# Patient Record
Sex: Female | Born: 1985 | Race: White | Hispanic: No | Marital: Married | Smoking: Former smoker
Health system: Southern US, Community
[De-identification: ages and names within clinical notes are randomized; demographics above are authoritative.]

## PROBLEM LIST (undated history)

## (undated) DIAGNOSIS — F419 Anxiety disorder, unspecified: Secondary | ICD-10-CM

## (undated) DIAGNOSIS — F32A Depression, unspecified: Secondary | ICD-10-CM

## (undated) DIAGNOSIS — F329 Major depressive disorder, single episode, unspecified: Secondary | ICD-10-CM

## (undated) DIAGNOSIS — I10 Essential (primary) hypertension: Secondary | ICD-10-CM

## (undated) DIAGNOSIS — E039 Hypothyroidism, unspecified: Secondary | ICD-10-CM

## (undated) DIAGNOSIS — D649 Anemia, unspecified: Secondary | ICD-10-CM

## (undated) DIAGNOSIS — R1115 Cyclical vomiting syndrome unrelated to migraine: Secondary | ICD-10-CM

## (undated) HISTORY — PX: WISDOM TOOTH EXTRACTION: SHX21

---

## 2005-09-30 ENCOUNTER — Emergency Department (HOSPITAL_COMMUNITY): Admission: EM | Admit: 2005-09-30 | Discharge: 2005-09-30 | Payer: Self-pay | Admitting: Family Medicine

## 2006-01-22 ENCOUNTER — Emergency Department (HOSPITAL_COMMUNITY): Admission: EM | Admit: 2006-01-22 | Discharge: 2006-01-22 | Payer: Self-pay | Admitting: Emergency Medicine

## 2008-01-30 ENCOUNTER — Emergency Department (HOSPITAL_COMMUNITY): Admission: EM | Admit: 2008-01-30 | Discharge: 2008-01-30 | Payer: Self-pay | Admitting: Emergency Medicine
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
## 2010-07-30 ENCOUNTER — Ambulatory Visit: Payer: Self-pay | Admitting: Hematology & Oncology

## 2010-08-23 ENCOUNTER — Other Ambulatory Visit: Payer: Self-pay | Admitting: Hematology & Oncology

## 2010-08-23 ENCOUNTER — Encounter (HOSPITAL_BASED_OUTPATIENT_CLINIC_OR_DEPARTMENT_OTHER): Payer: BLUE CROSS/BLUE SHIELD | Admitting: Hematology & Oncology

## 2010-08-23 DIAGNOSIS — E039 Hypothyroidism, unspecified: Secondary | ICD-10-CM

## 2010-08-23 DIAGNOSIS — F3289 Other specified depressive episodes: Secondary | ICD-10-CM

## 2010-08-23 DIAGNOSIS — D72829 Elevated white blood cell count, unspecified: Secondary | ICD-10-CM

## 2010-08-23 DIAGNOSIS — F329 Major depressive disorder, single episode, unspecified: Secondary | ICD-10-CM

## 2010-08-23 DIAGNOSIS — I10 Essential (primary) hypertension: Secondary | ICD-10-CM

## 2010-08-23 LAB — CBC WITH DIFFERENTIAL (CANCER CENTER ONLY)
BASO#: 0 10*3/uL (ref 0.0–0.2)
Eosinophils Absolute: 0.3 10*3/uL (ref 0.0–0.5)
HCT: 37.1 % (ref 34.8–46.6)
HGB: 13 g/dL (ref 11.6–15.9)
LYMPH#: 4.2 10*3/uL — ABNORMAL HIGH (ref 0.9–3.3)
MONO#: 0.6 10*3/uL (ref 0.1–0.9)
NEUT#: 7.8 10*3/uL — ABNORMAL HIGH (ref 1.5–6.5)
RBC: 4.33 10*6/uL (ref 3.70–5.32)

## 2010-08-23 LAB — SEDIMENTATION RATE: Sed Rate: 1 mm/hr (ref 0–22)

## 2010-08-23 LAB — VITAMIN B12: Vitamin B-12: 296 pg/mL (ref 211–911)

## 2010-08-23 LAB — LACTATE DEHYDROGENASE: LDH: 127 U/L (ref 94–250)

## 2010-08-27 LAB — BCR/ABL (LIO MMD)

## 2010-08-27 LAB — JAK-2 V617F

## 2011-02-10 LAB — URINALYSIS, ROUTINE W REFLEX MICROSCOPIC
Bilirubin Urine: NEGATIVE
Hgb urine dipstick: NEGATIVE
Ketones, ur: NEGATIVE
Protein, ur: NEGATIVE
Specific Gravity, Urine: 1.013
Urobilinogen, UA: 0.2

## 2012-06-27 ENCOUNTER — Emergency Department (HOSPITAL_COMMUNITY)
Admission: EM | Admit: 2012-06-27 | Discharge: 2012-06-27 | Disposition: A | Discharge status: Home / Self Care | Payer: BC Managed Care – PPO | Attending: Emergency Medicine | Admitting: Emergency Medicine

## 2012-06-27 ENCOUNTER — Encounter (HOSPITAL_COMMUNITY): Payer: Self-pay | Admitting: *Deleted

## 2012-06-27 DIAGNOSIS — Y929 Unspecified place or not applicable: Secondary | ICD-10-CM | POA: Insufficient documentation

## 2012-06-27 DIAGNOSIS — F17200 Nicotine dependence, unspecified, uncomplicated: Secondary | ICD-10-CM | POA: Insufficient documentation

## 2012-06-27 DIAGNOSIS — Z79899 Other long term (current) drug therapy: Secondary | ICD-10-CM | POA: Insufficient documentation

## 2012-06-27 DIAGNOSIS — T25229A Burn of second degree of unspecified foot, initial encounter: Secondary | ICD-10-CM | POA: Insufficient documentation

## 2012-06-27 DIAGNOSIS — Y939 Activity, unspecified: Secondary | ICD-10-CM | POA: Insufficient documentation

## 2012-06-27 DIAGNOSIS — T25022A Burn of unspecified degree of left foot, initial encounter: Secondary | ICD-10-CM

## 2012-06-27 DIAGNOSIS — X118XXA Contact with other hot tap-water, initial encounter: Secondary | ICD-10-CM | POA: Insufficient documentation

## 2012-06-27 MED ORDER — IBUPROFEN 600 MG PO TABS: 600.0000 mg | ORAL_TABLET | Freq: Three times a day (TID) | ORAL | Status: DC | PRN

## 2012-06-27 MED ORDER — IBUPROFEN 200 MG PO TABS
600.0000 mg | ORAL_TABLET | Freq: Once | ORAL | Status: AC
  Administered 2012-06-27: 600 mg via ORAL
  Filled 2012-06-27: qty 3

## 2012-06-27 MED ORDER — OXYCODONE-ACETAMINOPHEN 5-325 MG PO TABS
1.0000 | ORAL_TABLET | Freq: Once | ORAL | Status: AC
  Administered 2012-06-27: 1 via ORAL
  Filled 2012-06-27: qty 1

## 2012-06-27 MED ORDER — SILVER SULFADIAZINE 1 % EX CREA
TOPICAL_CREAM | Freq: Once | CUTANEOUS | Status: AC
  Administered 2012-06-27: 04:00:00 via TOPICAL
  Filled 2012-06-27: qty 85

## 2012-06-27 MED ORDER — OXYCODONE-ACETAMINOPHEN 5-325 MG PO TABS: 1.0000 | ORAL_TABLET | ORAL | Status: DC | PRN

## 2012-06-27 NOTE — ED Notes (Signed)
Cream applied, wound wrapped for pt to go home.
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
## 2012-06-27 NOTE — Discharge Instructions (Signed)
Burn Care  Your skin is a natural barrier to infection. It is the largest organ of your body. Burns damage this natural protection. To help prevent infection, it is very important to follow your caregiver's instructions in the care of your burn.  Burns are classified as:  · First degree. There is only redness of the skin (erythema). No scarring is expected.  · Second degree. There is blistering of the skin. Scarring may occur with deeper burns.  · Third degree. All layers of the skin are injured, and scarring is expected.  HOME CARE INSTRUCTIONS   · Wash your hands well before changing your bandage.  · Change your bandage as often as directed by your caregiver.  · Remove the old bandage. If the bandage sticks, you may soak it off with cool, clean water.  · Cleanse the burn thoroughly but gently with mild soap and water.  · Pat the area dry with a clean, dry cloth.  · Apply a thin layer of antibacterial cream to the burn.  · Apply a clean bandage as instructed by your caregiver.  · Keep the bandage as clean and dry as possible.  · Elevate the affected area for the first 24 hours, then as instructed by your caregiver.  · Only take over-the-counter or prescription medicines for pain, discomfort, or fever as directed by your caregiver.  SEEK IMMEDIATE MEDICAL CARE IF:   · You develop excessive pain.  · You develop redness, tenderness, swelling, or red streaks near the burn.  · The burned area develops yellowish-white fluid (pus) or a bad smell.  · You have a fever.  MAKE SURE YOU:   · Understand these instructions.  · Will watch your condition.  · Will get help right away if you are not doing well or get worse.  Document Released: 04/28/2005 Document Revised: 07/21/2011 Document Reviewed: 09/18/2010  Exit[employer]® Patient Information ©2013 [employer].

## 2015-12-10 ENCOUNTER — Encounter: Payer: Self-pay | Admitting: Hematology & Oncology

## 2015-12-28 ENCOUNTER — Encounter: Payer: Self-pay | Admitting: Hematology & Oncology

## 2015-12-28 ENCOUNTER — Ambulatory Visit: Payer: Managed Care, Other (non HMO)

## 2015-12-28 ENCOUNTER — Ambulatory Visit (HOSPITAL_BASED_OUTPATIENT_CLINIC_OR_DEPARTMENT_OTHER): Payer: Managed Care, Other (non HMO) | Admitting: Hematology & Oncology

## 2015-12-28 ENCOUNTER — Other Ambulatory Visit (HOSPITAL_BASED_OUTPATIENT_CLINIC_OR_DEPARTMENT_OTHER): Payer: Managed Care, Other (non HMO)

## 2015-12-28 VITALS — BP 101/62 | HR 64 | Temp 98.6°F | Resp 16 | Ht 68.0 in | Wt 157.0 lb

## 2015-12-28 DIAGNOSIS — D72829 Elevated white blood cell count, unspecified: Secondary | ICD-10-CM

## 2015-12-28 LAB — CBC WITH DIFFERENTIAL (CANCER CENTER ONLY)
BASO#: 0 10*3/uL (ref 0.0–0.2)
BASO%: 0.4 % (ref 0.0–2.0)
EOS%: 2.9 % (ref 0.0–7.0)
Eosinophils Absolute: 0.3 10*3/uL (ref 0.0–0.5)
HCT: 31.9 % — ABNORMAL LOW (ref 34.8–46.6)
HGB: 10.5 g/dL — ABNORMAL LOW (ref 11.6–15.9)
LYMPH#: 3.2 10*3/uL (ref 0.9–3.3)
LYMPH%: 32.5 % (ref 14.0–48.0)
MCH: 28.9 pg (ref 26.0–34.0)
MCHC: 32.9 g/dL (ref 32.0–36.0)
MCV: 88 fL (ref 81–101)
MONO#: 0.8 10*3/uL (ref 0.1–0.9)
MONO%: 8.2 % (ref 0.0–13.0)
NEUT#: 5.6 10*3/uL (ref 1.5–6.5)
NEUT%: 56 % (ref 39.6–80.0)
PLATELETS: 312 10*3/uL (ref 145–400)
RBC: 3.63 10*6/uL — AB (ref 3.70–5.32)
RDW: 12.7 % (ref 11.1–15.7)
WBC: 9.9 10*3/uL (ref 3.9–10.0)

## 2015-12-28 LAB — CHCC SATELLITE - SMEAR

## 2015-12-28 NOTE — Progress Notes (Signed)
Referral MD  Reason for Referral: Transient leukocytosis   Chief Complaint  Patient presents with  . Follow-up  : My white cell count has been high.  HPI: Anna Palmer is a very nice 30 year old white female. She is very interesting.  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 She obviously is in very good shape.   She was commonly referred because of transient leukocytosis. This obviously has been going on for 5 years. Back in April 2012, she had white cell count of 12.8. Showed a normal white cell differential.  Lab work that was also sent over showed back in February 2016 her white cell count to be 10.1. In June 2017, her white cell count was 11.1.  Most recently in July, her white cell count was 12.9. Hemoglobin 12.6 and platelet count 205,000. She had a normal white cell differential.  The big problem for her is that she has heavy cycles. She has fibroids. She'll be undergoing a myomectomy in October.  She does smoke. She probably smokes a half pack per day area did she's had no problem with infections. She's had no issues with skin rashes. She's had no weight loss or weight gain. She does have some hypothyroidism.   There's been no change in her medications.   She's had no foreign travel.   She does have quite a bit of body art but nothing that has been new.   She does have a glass of wine every now and then.   There is no family history of blood issues. She's had no past surgery. She's had no skin lesions removed.   Overall, her performance status is ECOG 0.    No past medical history on file.:  No past surgical history on file.:   Current Outpatient Prescriptions:  .  ALPRAZolam (XANAX) 0.5 MG tablet, Take 0.5 mg by mouth 2 (two) times daily as needed for anxiety., Disp: , Rfl:  .  levothyroxine (SYNTHROID, LEVOTHROID) 50 MCG tablet, Take 50 mcg by mouth daily., Disp: , Rfl:  .  lisinopril (PRINIVIL,ZESTRIL) 20 MG tablet, Take 20 mg by mouth daily., Disp: , Rfl:  .   norgestrel-ethinyl estradiol (LO/OVRAL,CRYSELLE) 0.3-30 MG-MCG tablet, Take 1 tablet by mouth daily., Disp: , Rfl:  .  PARoxetine (PAXIL) 40 MG tablet, Take 40 mg by mouth every morning., Disp: , Rfl:  .  zolpidem (AMBIEN) 5 MG tablet, Take 5 mg by mouth at bedtime as needed for sleep., Disp: , Rfl: :  :  No Known Allergies:  No family history on file.:  Social History   Social History  . Marital status: Single    Spouse name: N/A  . Number of children: N/A  . Years of education: N/A   Occupational History  . Not on file.   Social History Main Topics  . Smoking status: Current Every Day Smoker  . Smokeless tobacco: Never Used  . Alcohol use Yes  . Drug use: Unknown  . Sexual activity: Not on file   Other Topics Concern  . Not on file   Social History Narrative  . No narrative on file  :  Pertinent items are noted in HPI.  Exam: @IPVITALS @  well-developed and well-nourished white female in no obvious distress. Vital signs show a temperature of 98.6. Pulse 64. Blood pressure 101/62. Weight is 157 pounds. Head and neck exam shows no ocular or oral lesions. She has no palpable cervical or supraclavicular lymph nodes. Lungs are clear bilaterally. Cardiac exam regular rate  and rhythm with no murmurs, rubs or bruits. Abdomen is soft. She has good bowel sounds. There is no fluid wave. There is no palpable liver or spleen tip. Back exam shows no tenderness over the spine, ribs or hips. Extremities shows no clubbing, cyanosis or edema. She is good range of motion of her joints. There is no joint erythema or warmth. Skin exam shows no rashes, ecchymoses or petechia. Neurological exam shows no focal neurological deficits.    Recent Labs  12/28/15 1504  WBC 9.9  HGB 10.5*  HCT 31.9*  PLT 312   No results for input(s): NA, K, CL, CO2, GLUCOSE, BUN, CREATININE, CALCIUM in the last 72 hours.  Blood smear review:  Normochromic and normal see population of red blood cells. There is  no nucleated red blood cells. There are no teardrop cells. White blood cells are normal in morphology maturation. She has no hypersegmented polys. There is no immature myeloid or lymphoid forms. She has no atypical lymphocytes. Platelets are adequate in number and size.   Pathology: None     Assessment and Plan:  Anna Palmer is a very nice 30 year old white female with transient leukocytosis. I cannot find anything on her exam or on her blood smear that looks like a hematologic malignancy. Her white cell count is normal today.  She has had leukocytosis for 5 years. It is been mild at best. I would have to think that this is reactive. It might be related to her monthly cycles. It might be related to her smoking. I don't see anything that looks pathologic.  It will be interesting to see what happens after she has her uterine surgery. Possibly, this might be the "trigger" for the transient leuko-cytosis.  For now, I think we can probably get her back in about 4 or 5 months. I want to get her back after the holidays. She will have her surgery in October so I want to make sure that she heals up from this.  I spent about 40 minutes with her. She is very nice. She is very eloquent. It was fun talking to her. I answered her questions. I try to reassure her that I did not think that there was anything that we would have to treat.

## 2016-02-21 NOTE — Patient Instructions (Addendum)
Your procedure is scheduled on:  Monday, Oct. 23, 2017  Enter through the Main Entrance of [facility] at:  6:00 AM  Pick up the phone at the desk and dial [phone].  Call this number if you have problems the morning of surgery: [phone].  Remember: Do NOT eat food or drink after:  Midnight Sunday  Take these medicines the morning of surgery with a SIP OF WATER:  None  Stop ALL herbal medications at this time  Do NOT smoke the day of surgery  Do NOT wear jewelry (body piercing), metal hair clips/bobby pins, make-up, or nail polish. Do NOT wear lotions, powders, or perfumes.  You may wear deodorant. Do NOT shave for 48 hours prior to surgery. Do NOT bring valuables to the hospital. Contacts, dentures, or bridgework may not be worn into surgery.  Leave suitcase in car.  After surgery it may be brought to your room.  For patients admitted to the hospital, checkout time is 11:00 AM the day of discharge.

## 2016-02-25 ENCOUNTER — Encounter (HOSPITAL_COMMUNITY): Payer: Self-pay

## 2016-02-25 ENCOUNTER — Encounter (HOSPITAL_COMMUNITY)
Admission: RE | Admit: 2016-02-25 | Discharge: 2016-02-25 | Disposition: A | Discharge status: Home / Self Care | Payer: Managed Care, Other (non HMO) | Source: Ambulatory Visit | Attending: Obstetrics & Gynecology | Admitting: Obstetrics & Gynecology

## 2016-02-25 DIAGNOSIS — Z01818 Encounter for other preprocedural examination: Secondary | ICD-10-CM | POA: Diagnosis present

## 2016-02-25 HISTORY — DX: Major depressive disorder, single episode, unspecified: F32.9

## 2016-02-25 HISTORY — DX: Anemia, unspecified: D64.9

## 2016-02-25 HISTORY — DX: Essential (primary) hypertension: I10

## 2016-02-25 HISTORY — DX: Depression, unspecified: F32.A

## 2016-02-25 HISTORY — DX: Anxiety disorder, unspecified: F41.9

## 2016-02-25 HISTORY — DX: Hypothyroidism, unspecified: E03.9

## 2016-02-25 LAB — BASIC METABOLIC PANEL
Anion gap: 6 (ref 5–15)
BUN: 8 mg/dL (ref 6–20)
CHLORIDE: 107 mmol/L (ref 101–111)
CO2: 25 mmol/L (ref 22–32)
Calcium: 9.2 mg/dL (ref 8.9–10.3)
Creatinine, Ser: 0.53 mg/dL (ref 0.44–1.00)
GFR calc non Af Amer: >60 mL/min (ref >60)
Glucose, Bld: 88 mg/dL (ref 65–99)
POTASSIUM: 4 mmol/L (ref 3.5–5.1)
SODIUM: 138 mmol/L (ref 135–145)

## 2016-02-25 LAB — CBC
HEMATOCRIT: 28.7 % — AB (ref 36.0–46.0)
Hemoglobin: 8.9 g/dL — ABNORMAL LOW (ref 12.0–15.0)
MCH: 25 pg — ABNORMAL LOW (ref 26.0–34.0)
MCHC: 31 g/dL (ref 30.0–36.0)
MCV: 80.6 fL (ref 78.0–100.0)
PLATELETS: 346 10*3/uL (ref 150–400)
RBC: 3.56 MIL/uL — AB (ref 3.87–5.11)
RDW: 13.9 % (ref 11.5–15.5)
WBC: 10 10*3/uL (ref 4.0–10.5)

## 2016-02-25 LAB — TYPE AND SCREEN
ABO/RH(D): A POS
ANTIBODY SCREEN: NEGATIVE

## 2016-02-25 LAB — ABO/RH: ABO/RH(D): A POS

## 2016-02-27 ENCOUNTER — Other Ambulatory Visit (HOSPITAL_COMMUNITY): Payer: Self-pay | Admitting: *Deleted

## 2016-02-28 ENCOUNTER — Ambulatory Visit (HOSPITAL_COMMUNITY)
Admission: RE | Admit: 2016-02-28 | Discharge: 2016-02-28 | Disposition: A | Discharge status: Home / Self Care | Payer: Managed Care, Other (non HMO) | Source: Ambulatory Visit | Attending: Obstetrics & Gynecology | Admitting: Obstetrics & Gynecology

## 2016-02-28 DIAGNOSIS — D649 Anemia, unspecified: Secondary | ICD-10-CM | POA: Insufficient documentation

## 2016-02-28 DIAGNOSIS — Z130 Encounter for screening for diseases of the blood and blood-forming organs and certain disorders involving the immune mechanism: Secondary | ICD-10-CM | POA: Diagnosis present

## 2016-02-28 MED ORDER — SODIUM CHLORIDE 0.9 % IV SOLN
510.0000 mg | Freq: Once | INTRAVENOUS | Status: AC
  Administered 2016-02-28: 510 mg via INTRAVENOUS
  Filled 2016-02-28: qty 17

## 2016-02-28 NOTE — Progress Notes (Signed)
Pt here today for her first dose of IV feraheme.  Graceann Congress, RN and I were in with the patient.  I educated her on the possible side effects of nausea, the possibilty of a rise or fall in her BP, and that it would take 15 min for her to receive the medicine, then we would keep her for 30 min post infusio, just to let us know if she started to fell bad at any point.  Pt verbalized understanding.  Izora Gala Long asked her if she was pregnant and the pt denied pregnancy, and Izora Gala proceeded to place the IV. We hung the medication, as it was infusing I asked if she was okay and she stated yes, and stated that the medicine was just cold.  I asked her if she would like something to drink and she asked for water so I went to get it.  While getting the water Graceann Congress, RN yelled for help and to get some normal saline.  When I got in the room the patient appeared to have passed out, was pale, diaphoretic, her arms and hands were drawn in towards her chest, and her head was turned to the side, and she was unresponsive to verbal stimuli. Izora Gala had stopped the IV feraheme, laid the patient back in the chair, we obtained VS, BP was elevated from her baseline, sats 100%, and we hung a bolus of NS.  Pt only received an estimated 2 minutes of the medication.  After an estimated minute or less the patient opened her eyes to verbal stimuli, and her coloring began to return.  Anette Guarneri RN called Dr Nori Riis while Graceann Congress RN and I stayed with the patient.  Dorian Pod from Dr Kara Mead office stated to have the patient come to his office after she leaves here to be evaluated and to give the whole 250cc of NS bolus.  Pt's VS returned to basline, and she stated she was feeling better after approximately 15 minutes.  I instructed the patient to take her time, continue to rest, she drank a cup of water as well as a cup of coke.  I offered her some crackers but she did not feel up to eating.  Pt's VS remained stable 30 minutes after incident so I  gave her her call bell, told her that she can recover here with [place] until she feels better and if she feels like she needs it, she can have someone to come get her to take her to Dr. Verlon Au office.  Pt verbalized understanding and is resting comfortably. Will continue to monitor.

## 2016-02-28 NOTE — Progress Notes (Signed)
An hour post feraheme incident Pt's VS remained stable and at baseline, coloring is at baseline, pt smiling and states she feels okay to leave and okay to drive herself to the Dr's office.  Pt denies any dizziness with standing or sitting and I called and had the volunteers take her in a wheelchair to her car.  Pt DC home in stable condition.

## 2016-02-28 NOTE — H&P (Signed)
NAME:  RASHA, TUTT NO.:  [id number]  MEDICAL RECORD NO.:  [id number]  LOCATION:                                 FACILITY:  PHYSICIAN:  Maisie Fus, M.D.   DATE OF BIRTH:  1985/09/04  DATE OF ADMISSION: DATE OF DISCHARGE:                             HISTORY & PHYSICAL   PRESENT ILLNESS:  The patient is a 30 year old, white single female, nulligravida, who has a known history of very heavy menses with secondary anemia and previously had ultrasound findings in the office, which demonstrated at least 5 uterine leiomyomata.  There was a subserosal fibroid of 6.5 cm.  An intramural fibroid of 5.3 cm and a second intramural fibroid of 3.7 cm and 2 intracavitary fibroids measuring 2.5 cm and 3.1 cm.  The patient is contemplating marriage in the near future and is interested in preserving fertility.  Given her persistent menorrhagia and secondary anemia, she has requested definitive surgery and she is admitted now for a myomectomy.  She was in the office today for her preoperative consultation and at that time potential risk of the procedure included, which were discussed with the patient included the potential for a hysterectomy in the event of uncontrollable hemorrhage intraoperatively.  The risk of injury to other organs, risk of deep vein thrombosis, and the risk of infection and appropriate prophylactic measures have been discussed with the patient and she is admitted and prepared to proceed with surgery on the 23rd. Her hemoglobin in the office today was 8.5.  The plan is for her to have an iron infusion in the near future.  CURRENT REVIEW OF SYSTEMS:  Otherwise, negative.  The patient has negative GI, GU, cardiopulmonary symptoms.  PAST MEDICAL HISTORY:  Patient has no known significant medical issues except for hypertension and thyroid disease.  She takes lisinopril 20 mg a day.  She takes levothyroxine 50 mcg a day.  She takes Paxil 40 mg a day for  anxiety.  ALLERGIES:  She has no known allergies to medications.  SOCIAL HISTORY:  She smokes one-half pack of cigarettes per day.  She has approximately 1 drink per day.  FAMILY HISTORY:  Noncontributory.  PHYSICAL EXAMINATION:  HEENT:  Grossly within normal limits.  To my examination of the thyroid gland is not enlarged. VITAL SIGNS:  Blood pressure in the office 120/80, weight 153, height 5 feet 8-1/2 inches. CHEST:  Clear to auscultation throughout. HEART:  Normal sinus rhythm without murmurs, rubs, or gallops. ABDOMEN:  Soft and nontender.  There is appreciable lower abdominal mass consistent with the myomas. PELVIC:  External genitalia, vagina, and cervix are normal. EXTREMITIES:  Without cyanosis, clubbing, or edema.  ASSESSMENT:  Multiple large uterine leiomyomata with secondary menorrhagia and subsequent anemia.  PLAN:  Myomectomy, perioperative antibiotics, PAS hose, early ambulation as ways of reducing perioperative risk.     Maisie Fus, M.D.   ______________________________ Viona Gilmore. Evette Cristal, M.D.    WRN/MEDQ  D:  02/25/2016  T:  02/26/2016  Job:  [id number]

## 2016-03-02 MED ORDER — CEFOTETAN DISODIUM 2 G IJ SOLR
2.0000 g | INTRAMUSCULAR | Status: AC
  Administered 2016-03-03: 2 g via INTRAVENOUS
  Filled 2016-03-02: qty 2

## 2016-03-03 ENCOUNTER — Ambulatory Visit (HOSPITAL_COMMUNITY): Payer: Managed Care, Other (non HMO) | Admitting: Anesthesiology

## 2016-03-03 ENCOUNTER — Encounter (HOSPITAL_COMMUNITY)
Admission: RE | Disposition: A | Discharge status: Home / Self Care | Payer: Self-pay | Source: Ambulatory Visit | Attending: Obstetrics & Gynecology

## 2016-03-03 ENCOUNTER — Encounter (HOSPITAL_COMMUNITY): Payer: Self-pay

## 2016-03-03 ENCOUNTER — Observation Stay (HOSPITAL_COMMUNITY)
Admission: RE | Admit: 2016-03-03 | Discharge: 2016-03-05 | Disposition: A | Discharge status: Home / Self Care | Payer: Managed Care, Other (non HMO) | Source: Ambulatory Visit | Attending: Obstetrics & Gynecology | Admitting: Obstetrics & Gynecology

## 2016-03-03 DIAGNOSIS — N920 Excessive and frequent menstruation with regular cycle: Secondary | ICD-10-CM | POA: Diagnosis not present

## 2016-03-03 DIAGNOSIS — D259 Leiomyoma of uterus, unspecified: Secondary | ICD-10-CM | POA: Diagnosis not present

## 2016-03-03 DIAGNOSIS — F17200 Nicotine dependence, unspecified, uncomplicated: Secondary | ICD-10-CM | POA: Diagnosis not present

## 2016-03-03 DIAGNOSIS — I10 Essential (primary) hypertension: Secondary | ICD-10-CM | POA: Diagnosis not present

## 2016-03-03 DIAGNOSIS — Z79899 Other long term (current) drug therapy: Secondary | ICD-10-CM | POA: Insufficient documentation

## 2016-03-03 DIAGNOSIS — D649 Anemia, unspecified: Secondary | ICD-10-CM | POA: Diagnosis not present

## 2016-03-03 HISTORY — PX: MYOMECTOMY: SHX85

## 2016-03-03 LAB — CBC
HEMATOCRIT: 26.3 % — AB (ref 36.0–46.0)
HEMOGLOBIN: 8.2 g/dL — AB (ref 12.0–15.0)
MCH: 25.2 pg — ABNORMAL LOW (ref 26.0–34.0)
MCHC: 31.2 g/dL (ref 30.0–36.0)
MCV: 80.9 fL (ref 78.0–100.0)
Platelets: 279 10*3/uL (ref 150–400)
RBC: 3.25 MIL/uL — AB (ref 3.87–5.11)
RDW: 15 % (ref 11.5–15.5)
WBC: 17.2 10*3/uL — AB (ref 4.0–10.5)

## 2016-03-03 LAB — PREGNANCY, URINE: PREG TEST UR: NEGATIVE

## 2016-03-03 SURGERY — MYOMECTOMY, ABDOMINAL APPROACH
Anesthesia: General | Site: Abdomen

## 2016-03-03 MED ORDER — PROPOFOL 10 MG/ML IV BOLUS
INTRAVENOUS | Status: AC
  Filled 2016-03-03: qty 20

## 2016-03-03 MED ORDER — NALOXONE HCL 0.4 MG/ML IJ SOLN: 0.4000 mg | INTRAMUSCULAR | Status: DC | PRN

## 2016-03-03 MED ORDER — OXYCODONE-ACETAMINOPHEN 5-325 MG PO TABS
1.0000 | ORAL_TABLET | ORAL | Status: DC | PRN
  Administered 2016-03-04 – 2016-03-05 (×6): 2 via ORAL
  Filled 2016-03-03 (×6): qty 2

## 2016-03-03 MED ORDER — SODIUM CHLORIDE 0.9% FLUSH: 9.0000 mL | INTRAVENOUS | Status: DC | PRN

## 2016-03-03 MED ORDER — MIDAZOLAM HCL 2 MG/2ML IJ SOLN
INTRAMUSCULAR | Status: AC
  Filled 2016-03-03: qty 2

## 2016-03-03 MED ORDER — GLYCOPYRROLATE 0.2 MG/ML IJ SOLN
INTRAMUSCULAR | Status: AC
  Filled 2016-03-03: qty 2

## 2016-03-03 MED ORDER — HYDROMORPHONE HCL 1 MG/ML IJ SOLN
INTRAMUSCULAR | Status: AC
  Filled 2016-03-03: qty 1

## 2016-03-03 MED ORDER — SODIUM CHLORIDE 0.9 % IV SOLN
INTRAVENOUS | Status: DC | PRN
  Administered 2016-03-03: 33 mL via INTRAMUSCULAR

## 2016-03-03 MED ORDER — LACTATED RINGERS IV SOLN
INTRAVENOUS | Status: DC
  Administered 2016-03-03: 125 mL/h via INTRAVENOUS
  Administered 2016-03-03: 08:00:00 via INTRAVENOUS

## 2016-03-03 MED ORDER — NEOSTIGMINE METHYLSULFATE 10 MG/10ML IV SOLN
INTRAVENOUS | Status: AC
  Filled 2016-03-03: qty 1

## 2016-03-03 MED ORDER — 0.9 % SODIUM CHLORIDE (POUR BTL) OPTIME
TOPICAL | Status: DC | PRN
  Administered 2016-03-03: 1000 mL

## 2016-03-03 MED ORDER — VASOPRESSIN 20 UNIT/ML IV SOLN
INTRAVENOUS | Status: AC
  Filled 2016-03-03: qty 1

## 2016-03-03 MED ORDER — SODIUM CHLORIDE 0.9 % IJ SOLN
INTRAMUSCULAR | Status: AC
  Filled 2016-03-03: qty 20

## 2016-03-03 MED ORDER — FENTANYL CITRATE (PF) 250 MCG/5ML IJ SOLN
INTRAMUSCULAR | Status: AC
  Filled 2016-03-03: qty 5

## 2016-03-03 MED ORDER — KETOROLAC TROMETHAMINE 30 MG/ML IJ SOLN
30.0000 mg | Freq: Once | INTRAMUSCULAR | Status: AC
  Administered 2016-03-03: 30 mg via INTRAVENOUS

## 2016-03-03 MED ORDER — ROCURONIUM BROMIDE 100 MG/10ML IV SOLN
INTRAVENOUS | Status: AC
  Filled 2016-03-03: qty 1

## 2016-03-03 MED ORDER — SCOPOLAMINE 1 MG/3DAYS TD PT72
1.0000 | MEDICATED_PATCH | Freq: Once | TRANSDERMAL | Status: DC
  Administered 2016-03-03: 1.5 mg via TRANSDERMAL

## 2016-03-03 MED ORDER — BUPIVACAINE LIPOSOME 1.3 % IJ SUSP
20.0000 mL | Freq: Once | INTRAMUSCULAR | Status: DC
Start: 2016-03-03 — End: 2016-03-05
  Filled 2016-03-03: qty 20

## 2016-03-03 MED ORDER — MIDAZOLAM HCL 2 MG/2ML IJ SOLN
INTRAMUSCULAR | Status: DC | PRN
  Administered 2016-03-03: 2 mg via INTRAVENOUS

## 2016-03-03 MED ORDER — ONDANSETRON HCL 4 MG/2ML IJ SOLN
INTRAMUSCULAR | Status: DC | PRN
  Administered 2016-03-03: 4 mg via INTRAVENOUS

## 2016-03-03 MED ORDER — ROCURONIUM BROMIDE 100 MG/10ML IV SOLN
INTRAVENOUS | Status: DC | PRN
  Administered 2016-03-03: 10 mg via INTRAVENOUS
  Administered 2016-03-03: 40 mg via INTRAVENOUS

## 2016-03-03 MED ORDER — HYDROMORPHONE HCL 1 MG/ML IJ SOLN
0.2500 mg | INTRAMUSCULAR | Status: DC | PRN
  Administered 2016-03-03: 0.25 mg via INTRAVENOUS
  Administered 2016-03-03 (×3): 0.5 mg via INTRAVENOUS
  Administered 2016-03-03: 0.25 mg via INTRAVENOUS

## 2016-03-03 MED ORDER — MEPERIDINE HCL 25 MG/ML IJ SOLN: 6.2500 mg | INTRAMUSCULAR | Status: DC | PRN

## 2016-03-03 MED ORDER — FENTANYL CITRATE (PF) 100 MCG/2ML IJ SOLN
INTRAMUSCULAR | Status: DC | PRN
  Administered 2016-03-03: 150 ug via INTRAVENOUS
  Administered 2016-03-03 (×2): 100 ug via INTRAVENOUS

## 2016-03-03 MED ORDER — SODIUM CHLORIDE 0.9 % IV SOLN
INTRAVENOUS | Status: DC | PRN
  Administered 2016-03-03: 28 mL

## 2016-03-03 MED ORDER — HYDROMORPHONE HCL 1 MG/ML IJ SOLN
INTRAMUSCULAR | Status: AC
  Administered 2016-03-03: 0.25 mg via INTRAVENOUS
  Filled 2016-03-03: qty 1

## 2016-03-03 MED ORDER — ONDANSETRON HCL 4 MG/2ML IJ SOLN
INTRAMUSCULAR | Status: AC
  Filled 2016-03-03: qty 2

## 2016-03-03 MED ORDER — PROMETHAZINE HCL 25 MG/ML IJ SOLN: 12.5000 mg | Freq: Once | INTRAMUSCULAR | Status: DC | PRN

## 2016-03-03 MED ORDER — NEOSTIGMINE METHYLSULFATE 10 MG/10ML IV SOLN
INTRAVENOUS | Status: DC | PRN
  Administered 2016-03-03: 2.5 mg via INTRAVENOUS

## 2016-03-03 MED ORDER — PROMETHAZINE HCL 25 MG/ML IJ SOLN
12.5000 mg | INTRAMUSCULAR | Status: DC | PRN
  Administered 2016-03-04: 12.5 mg via INTRAVENOUS
  Filled 2016-03-03: qty 1

## 2016-03-03 MED ORDER — HYDROMORPHONE HCL 1 MG/ML IJ SOLN
INTRAMUSCULAR | Status: AC
  Administered 2016-03-03: 0.5 mg via INTRAVENOUS
  Filled 2016-03-03: qty 1

## 2016-03-03 MED ORDER — DEXTROSE-NACL 5-0.45 % IV SOLN
INTRAVENOUS | Status: DC
  Administered 2016-03-03 – 2016-03-04 (×3): via INTRAVENOUS

## 2016-03-03 MED ORDER — SCOPOLAMINE 1 MG/3DAYS TD PT72
MEDICATED_PATCH | TRANSDERMAL | Status: AC
  Administered 2016-03-03: 1.5 mg via TRANSDERMAL
  Filled 2016-03-03: qty 1

## 2016-03-03 MED ORDER — GLYCOPYRROLATE 0.2 MG/ML IJ SOLN
INTRAMUSCULAR | Status: DC | PRN
  Administered 2016-03-03: 0.4 mg via INTRAVENOUS

## 2016-03-03 MED ORDER — DEXAMETHASONE SODIUM PHOSPHATE 4 MG/ML IJ SOLN
INTRAMUSCULAR | Status: AC
  Filled 2016-03-03: qty 1

## 2016-03-03 MED ORDER — SODIUM CHLORIDE 0.9 % IJ SOLN
INTRAMUSCULAR | Status: AC
  Filled 2016-03-03: qty 100

## 2016-03-03 MED ORDER — FENTANYL CITRATE (PF) 100 MCG/2ML IJ SOLN
INTRAMUSCULAR | Status: AC
  Filled 2016-03-03: qty 2

## 2016-03-03 MED ORDER — DEXAMETHASONE SODIUM PHOSPHATE 10 MG/ML IJ SOLN
INTRAMUSCULAR | Status: DC | PRN
  Administered 2016-03-03: 4 mg via INTRAVENOUS

## 2016-03-03 MED ORDER — LIDOCAINE HCL (CARDIAC) 20 MG/ML IV SOLN
INTRAVENOUS | Status: AC
  Filled 2016-03-03: qty 5

## 2016-03-03 MED ORDER — SODIUM CHLORIDE 0.9 % IV SOLN: 20.0000 mL | Freq: Once | INTRAVENOUS | Status: DC

## 2016-03-03 MED ORDER — MORPHINE SULFATE 2 MG/ML IV SOLN
INTRAVENOUS | Status: DC
  Administered 2016-03-03: 12 mL via INTRAVENOUS
  Administered 2016-03-03: 7.5 mL via INTRAVENOUS
  Administered 2016-03-03: 18:00:00 via INTRAVENOUS
  Administered 2016-03-03: 43.5 mg via INTRAVENOUS
  Administered 2016-03-03: 11:00:00 via INTRAVENOUS
  Administered 2016-03-04: 24 mg via INTRAVENOUS
  Administered 2016-03-04: 7.2 mg via INTRAVENOUS
  Filled 2016-03-03 (×2): qty 25

## 2016-03-03 MED ORDER — LIDOCAINE HCL (CARDIAC) 20 MG/ML IV SOLN
INTRAVENOUS | Status: DC | PRN
  Administered 2016-03-03: 50 mg via INTRAVENOUS

## 2016-03-03 MED ORDER — KETOROLAC TROMETHAMINE 30 MG/ML IJ SOLN
INTRAMUSCULAR | Status: AC
  Filled 2016-03-03: qty 1

## 2016-03-03 MED ORDER — HYDROMORPHONE HCL 1 MG/ML IJ SOLN
INTRAMUSCULAR | Status: DC | PRN
  Administered 2016-03-03: 1 mg via INTRAVENOUS

## 2016-03-03 MED ORDER — PROPOFOL 10 MG/ML IV BOLUS
INTRAVENOUS | Status: DC | PRN
  Administered 2016-03-03 (×2): 40 mg via INTRAVENOUS
  Administered 2016-03-03: 160 mg via INTRAVENOUS

## 2016-03-03 SURGICAL SUPPLY — 36 items
BARRIER ADHS 3X4 INTERCEED (GAUZE/BANDAGES/DRESSINGS) ×1 IMPLANT
BRR ADH 4X3 ABS CNTRL BYND (GAUZE/BANDAGES/DRESSINGS) ×1
CANISTER SUCT 3000ML (MISCELLANEOUS) ×2 IMPLANT
CLOTH BEACON ORANGE TIMEOUT ST (SAFETY) ×2 IMPLANT
CONT PATH 16OZ SNAP LID 3702 (MISCELLANEOUS) ×2 IMPLANT
GAUZE SPONGE 4X4 16PLY XRAY LF (GAUZE/BANDAGES/DRESSINGS) ×1 IMPLANT
GLOVE BIO SURGEON STRL SZ7.5 (GLOVE) ×2 IMPLANT
GLOVE BIOGEL PI IND STRL 7.0 (GLOVE) ×1 IMPLANT
GLOVE BIOGEL PI INDICATOR 7.0 (GLOVE) ×1
GOWN STRL REUS W/TWL LRG LVL3 (GOWN DISPOSABLE) ×2 IMPLANT
NDL SPNL 22GX7 QUINCKE BK (NEEDLE) IMPLANT
NEEDLE SPNL 22GX7 QUINCKE BK (NEEDLE) ×2 IMPLANT
NS IRRIG 1000ML POUR BTL (IV SOLUTION) ×2 IMPLANT
PACK ABDOMINAL GYN (CUSTOM PROCEDURE TRAY) ×2 IMPLANT
PAD OB MATERNITY 4.3X12.25 (PERSONAL CARE ITEMS) ×2 IMPLANT
PENCIL SMOKE EVAC W/HOLSTER (ELECTROSURGICAL) ×2 IMPLANT
PROTECTOR NERVE ULNAR (MISCELLANEOUS) ×2 IMPLANT
STAPLER VISISTAT 35W (STAPLE) ×2 IMPLANT
SUT CHROMIC 3 0 SH 27 (SUTURE) IMPLANT
SUT CHROMIC 3 0 TIES (SUTURE) ×1 IMPLANT
SUT ETHILON 3 0 FSL (SUTURE) IMPLANT
SUT MNCRL 0 MO-4 VIOLET 18 CR (SUTURE) ×3 IMPLANT
SUT MNCRL 0 VIOLET 6X18 (SUTURE) ×1 IMPLANT
SUT MNCRL AB 0 CT1 27 (SUTURE) ×2 IMPLANT
SUT MON AB 2-0 CT1 27 (SUTURE) IMPLANT
SUT MON AB 2-0 SH 27 (SUTURE) ×6
SUT MON AB 2-0 SH27 (SUTURE) ×4 IMPLANT
SUT MONOCRYL 0 6X18 (SUTURE) ×1
SUT MONOCRYL 0 MO 4 18  CR/8 (SUTURE) ×2
SUT PDS AB 0 CT1 27 (SUTURE) ×3 IMPLANT
SUT PLAIN 2 0 XLH (SUTURE) ×1 IMPLANT
SUT PROLENE 3 0 FS 2 (SUTURE) IMPLANT
SYR CONTROL 10ML LL (SYRINGE) ×2 IMPLANT
TOWEL OR 17X24 6PK STRL BLUE (TOWEL DISPOSABLE) ×4 IMPLANT
TRAY FOLEY CATH SILVER 14FR (SET/KITS/TRAYS/PACK) ×2 IMPLANT
WATER STERILE IRR 1000ML POUR (IV SOLUTION) ×2 IMPLANT

## 2016-03-03 NOTE — Anesthesia Procedure Notes (Signed)
Procedures

## 2016-03-03 NOTE — Transfer of Care (Signed)
Immediate Anesthesia Transfer of Care Note  Patient: Anna Palmer  Procedure(s) Performed: Procedure(s): ABDOMINAL MYOMECTOMY (N/A)  Patient Location: PACU  Anesthesia Type:General  Level of Consciousness: awake, alert  and oriented  Airway & Oxygen Therapy: Patient Spontanous Breathing and Patient connected to nasal cannula oxygen  Post-op Assessment: Report given to RN and Post -op Vital signs reviewed and stable  Post vital signs: Reviewed and stable  Last Vitals:  Vitals:   03/03/16 0615  BP: 114/72  Pulse: 68  Resp: 20  Temp: 37 C    Last Pain:  Vitals:   03/03/16 0615  TempSrc: Oral  PainSc: 3       Patients Stated Pain Goal: 4 ([id number])  Complications: No apparent anesthesia complications

## 2016-03-03 NOTE — Anesthesia Postprocedure Evaluation (Signed)
Anesthesia Post Note  Patient: Anna Palmer  Procedure(s) Performed: Procedure(s) (LRB): ABDOMINAL MYOMECTOMY (N/A)  Patient location during evaluation: PACU Anesthesia Type: General Level of consciousness: awake and sedated Pain management: pain level controlled Vital Signs Assessment: post-procedure vital signs reviewed and stable Respiratory status: spontaneous breathing Cardiovascular status: stable Postop Assessment: no signs of nausea or vomiting Anesthetic complications: no     Last Vitals:  Vitals:   03/03/16 0904 03/03/16 1000  BP:    Pulse: 89   Resp: 18   Temp:  36.7 C    Last Pain:  Vitals:   03/03/16 0940  TempSrc:   PainSc: 6    Pain Goal: Patients Stated Pain Goal: 4 (03/03/16 0615)               [place]

## 2016-03-03 NOTE — Anesthesia Procedure Notes (Signed)
Procedure Name: Intubation Date/Time: 03/03/2016 7:38 AM Performed by: Jonna Munro Pre-anesthesia Checklist: Patient identified, Emergency Drugs available, Suction available, Timeout performed and Patient being monitored Patient Re-evaluated:Patient Re-evaluated prior to inductionOxygen Delivery Method: Circle system utilized Preoxygenation: Pre-oxygenation with 100% oxygen Intubation Type: IV induction Ventilation: Mask ventilation without difficulty Laryngoscope Size: Mac and 3 Grade View: Grade I Tube type: Oral Laser Tube: Cuffed inflated with minimal occlusive pressure - saline Tube size: 7.0 mm Number of attempts: 1 Airway Equipment and Method: Stylet Placement Confirmation: ETT inserted through vocal cords under direct vision,  positive ETCO2 and breath sounds checked- equal and bilateral Secured at: 21 cm Tube secured with: Tape Dental Injury: Teeth and Oropharynx as per pre-operative assessment

## 2016-03-03 NOTE — Addendum Note (Signed)
Addendum  created 03/03/16 1453 by Jonna Munro, CRNA   Sign clinical note

## 2016-03-03 NOTE — Anesthesia Preprocedure Evaluation (Addendum)
Anesthesia Evaluation  Patient identified by MRN, date of birth, ID band  Reviewed: Allergy & Precautions, H&P , NPO status , Patient's Chart, lab work & pertinent test results, reviewed documented beta blocker date and time   Airway Mallampati: I  TM Distance: >3 FB Neck ROM: full    Dental no notable dental hx. (+) Teeth Intact   Pulmonary neg pulmonary ROS, Current Smoker,    Pulmonary exam normal        Cardiovascular hypertension, Pt. on medications Normal cardiovascular exam     Neuro/Psych negative neurological ROS     GI/Hepatic negative GI ROS, Neg liver ROS,   Endo/Other    Renal/GU negative Renal ROS     Musculoskeletal   Abdominal Normal abdominal exam  (+)   Peds  Hematology   Anesthesia Other Findings   Reproductive/Obstetrics negative OB ROS                             Anesthesia Physical Anesthesia Plan  ASA: II  Anesthesia Plan: General   Post-op Pain Management:    Induction:   Airway Management Planned: Oral ETT  Additional Equipment:   Intra-op Plan:   Post-operative Plan: Extubation in OR  Informed Consent: I have reviewed the patients History and Physical, chart, labs and discussed the procedure including the risks, benefits and alternatives for the proposed anesthesia with the patient or authorized representative who has indicated his/her understanding and acceptance.   Dental advisory given  Plan Discussed with: CRNA and Surgeon  Anesthesia Plan Comments:         Anesthesia Quick Evaluation

## 2016-03-03 NOTE — Anesthesia Postprocedure Evaluation (Signed)
Anesthesia Post Note  Patient: Anna Palmer  Procedure(s) Performed: Procedure(s) (LRB): ABDOMINAL MYOMECTOMY (N/A)  Patient location during evaluation: [facility] Level of consciousness: awake, awake and alert and oriented Pain management: pain level controlled Vital Signs Assessment: post-procedure vital signs reviewed and stable Respiratory status: spontaneous breathing, respiratory function stable and patient connected to nasal cannula oxygen Cardiovascular status: stable Postop Assessment: no signs of nausea or vomiting and adequate PO intake Anesthetic complications: no     Last Vitals:  Vitals:   03/03/16 1224 03/03/16 1332  BP: 116/77   Pulse: 91   Resp: 16 18  Temp: 36.8 C     Last Pain:  Vitals:   03/03/16 1338  TempSrc:   PainSc: Asleep   Pain Goal: Patients Stated Pain Goal: 4 (03/03/16 1035)               Willa Rough

## 2016-03-04 ENCOUNTER — Encounter (HOSPITAL_COMMUNITY): Payer: Self-pay | Admitting: Obstetrics & Gynecology

## 2016-03-04 DIAGNOSIS — D259 Leiomyoma of uterus, unspecified: Secondary | ICD-10-CM | POA: Diagnosis not present

## 2016-03-04 LAB — CBC
HCT: 21.4 % — ABNORMAL LOW (ref 36.0–46.0)
Hemoglobin: 6.6 g/dL — CL (ref 12.0–15.0)
MCH: 25.5 pg — AB (ref 26.0–34.0)
MCHC: 30.8 g/dL (ref 30.0–36.0)
MCV: 82.6 fL (ref 78.0–100.0)
Platelets: 223 10*3/uL (ref 150–400)
RBC: 2.59 MIL/uL — AB (ref 3.87–5.11)
RDW: 15.5 % (ref 11.5–15.5)
WBC: 12.5 10*3/uL — ABNORMAL HIGH (ref 4.0–10.5)

## 2016-03-04 MED ORDER — MENTHOL 3 MG MT LOZG
1.0000 | LOZENGE | OROMUCOSAL | Status: DC | PRN
  Administered 2016-03-04: 3 mg via ORAL
  Filled 2016-03-04: qty 9

## 2016-03-04 NOTE — Progress Notes (Signed)
Post -op day 1  S: complains of incisional pain, Last pain med was at 5 am O: VSS abd soft, Minimal drainage noted on bandage. Small ecchymosis noted inferior to incisional site  Foley removed this am, voided QS A: Post op day 1 s/p myomectomy  P: check CBC this am Ambulate

## 2016-03-04 NOTE — Op Note (Signed)
NAMEMarland Kitchen  Anna, Palmer NO.:  [id number]  MEDICAL RECORD NO.:  [id number]  LOCATION:  [id number]                          FACILITY:  [facility]  PHYSICIAN:  Maisie Fus, M.D.   DATE OF BIRTH:  1985/06/08  DATE OF PROCEDURE:  03/03/2016 DATE OF DISCHARGE:                              OPERATIVE REPORT   PREOPERATIVE DIAGNOSES:  Multiple uterine leiomyomata, clinical history of severe menorrhagia and secondary anemia.  POSTOPERATIVE DIAGNOSES:  Multiple uterine leiomyomata, clinical history of severe menorrhagia and secondary anemia.  OPERATIVE PROCEDURE:  Myomectomy.  SURGEON:  Maisie Fus, M.D.  ASSISTANT:  __________  ESTIMATED INTRAOPERATIVE BLOOD LOSS:  200 mL.  INTRAOPERATIVE COMPLICATIONS:  None.  Details of the present illness are recorded in the admission note.  DESCRIPTION OF PROCEDURE:  The patient was admitted on the morning of surgery.  She was given a 2 g bolus of Ancef IV.  She was placed in PAS hose.  She was brought to the operating room.  There, placed under adequate general endotracheal anesthesia.  Placed in the dorsal recumbent position.  The abdomen, perineum, and vagina were prepped in the usual fashion.  Foley catheter was placed with sterile technique. Sterile drapes were applied.  A lower abdominal transverse incision was made and intentionally avoided tattoo on her lower abdomen.  Then, the incision was carried sharply down the fascia, which was entered sharply and extended to the extent of skin incision.  Bleeding subcutaneous vessels were controlled with Bovie.  The rectus sheath was then developed using blunt and sharp dissection, entering the rectus muscle from the overlying fascia both superiorly and inferiorly.  Rectus muscles were divided in the midline.  Perineum was elevated and entered sharply and extended bluntly to the extent of skin incision.  The uterus filled the pelvis, but with care and persistence, uterus was  delivered through the abdominal incision and on to the anterior abdominal wall.  Numerous fibroids were noted including 1 anteriorly and 1 posteriorly more or less in the midline.  Dictation ended at this point.     Maisie Fus, M.D.     WRN/MEDQ  D:  03/03/2016  T:  03/04/2016  Job:  [id number]

## 2016-03-04 NOTE — Op Note (Signed)
NAMEMarland Kitchen  IOMA, MENCER NO.:  [id number]  MEDICAL RECORD NO.:  [id number]  LOCATION:  9320                          FACILITY:  [facility]  PHYSICIAN:  Maisie Fus, M.D.   DATE OF BIRTH:  Jan 09, 1986  DATE OF PROCEDURE: DATE OF DISCHARGE:                              OPERATIVE REPORT   PREOPERATIVE DIAGNOSES:  Uterine leiomyomata, menorrhagia, secondary anemia.  POSTOPERATIVE DIAGNOSES:  Uterine leiomyomata, menorrhagia, secondary anemia.  OPERATIVE PROCEDURE:  Myomectomy.  SURGEON:  Maisie Fus, M.D.  ASSISTANTRadene Knee.  ANESTHESIA:  General endotracheal.  ESTIMATED INTRAOPERATIVE BLOOD LOSS:  200 mL.  INTRAOPERATIVE COMPLICATIONS:  None.  Details of the present illness recorded in the admission note.  DESCRIPTION OF PROCEDURE:  The patient was admitted on the morning of surgery.  She was given a bolus of Ancef IV.  She was placed in PAS hose.  She was brought to the operating room and placed under general endotracheal anesthesia in the dorsal recumbent position.  The abdomen, perineum, and vagina were prepped in the usual fashion.  Foley placed using sterile technique.  Sterile drapes were applied.  A lower abdominal transverse incision was made, partially avoiding lower abdominal tattoo.  This incision was carried sharply down to fascia, which was entered sharply and extended to the extent of skin incision. Bleeding subcutaneous vessels were controlled with the Bovie.  Rectus sheath was developed superiorly and inferiorly with blunt and sharp dissection.  Rectus muscles were divided in the midline.  Peritoneum was elevated and eventually entered safely with sharp dissection.  The peritoneal incision was extended with blunt dissection.  The uterus was then delivered onto the anterior abdominal wall and into the incision to allow exposure of the fibroids.  The 2 larger ones were noted both anteriorly and posteriorly essentially in the midline.  With a  scalpel, an incision was made over the anterior myoma, and with careful sharp and blunt dissection and traction with towel clip or thyroid tenaculum the myoma was fully excised with sharp and blunt dissection.  There were smaller myomas in the anterior abdominal wall, 1 measuring approximately 1 cm and additional 3-4 mm size myomas.  All visible ones were removed. The endometrial cavity was then entered.  Palpation did reveal intracavitary fibroids x2.  These were approximately 1 to 1.5 cm.  These were grasped with the thyroid tenaculum and surgically excised.  No further lesions were noted in the endometrium.  The anterior incision was then closed in layers.  Zero Monocryl pop-offs were used with interrupted sutures to close the first layer of the myometrium. Additional sutures were used to close secondary areas as needed.  The serosal incision was then closed with a running 2-0 Monocryl suture. The next fibroid was to posterior midline one, which was essentially treated in the same way as the anterior one.  The closure was also essentially identical.  At this point, hemostasis was noted to be adequate.  The tubes and ovaries were inspected on each side and found to be normal.  Pack, needle, instrument counts were correct.  The uterus was delivered back into the abdominal cavity, and Surgicel placed over the incisions.  The abdominal incision  was closed in layers.  A double 0 PDS was used to close the fascia, 3-0 plain was used to close the subcutaneous tissue, and staples were used to close the skin.  Exparel 20 mL diluted with saline to 40 was used and injected along the fascial incision lines and along the skin for postoperative analgesia.  Staples were used to close the skin.  Hemostasis was adequate.  Sterile dressing was applied, and the patient taken to recovery in good condition.     Maisie Fus, M.D.     WRN/MEDQ  D:  03/03/2016  T:  03/04/2016  Job:  [id number]

## 2016-03-05 DIAGNOSIS — D259 Leiomyoma of uterus, unspecified: Secondary | ICD-10-CM | POA: Diagnosis not present

## 2016-03-05 LAB — CBC
HEMATOCRIT: 21 % — AB (ref 36.0–46.0)
Hemoglobin: 6.7 g/dL — CL (ref 12.0–15.0)
MCH: 26.7 pg (ref 26.0–34.0)
MCHC: 31.9 g/dL (ref 30.0–36.0)
MCV: 83.7 fL (ref 78.0–100.0)
PLATELETS: 209 10*3/uL (ref 150–400)
RBC: 2.51 MIL/uL — ABNORMAL LOW (ref 3.87–5.11)
RDW: 16.3 % — AB (ref 11.5–15.5)
WBC: 11.9 10*3/uL — ABNORMAL HIGH (ref 4.0–10.5)

## 2016-03-05 MED ORDER — IBUPROFEN 200 MG PO TABS: 600.0000 mg | ORAL_TABLET | Freq: Four times a day (QID) | ORAL | 0 refills | Status: DC | PRN

## 2016-03-05 NOTE — Progress Notes (Signed)
Hgb remains 6.6 this am will report to day RN to report to NP/MD this am on rounds

## 2016-03-05 NOTE — Progress Notes (Signed)
HOD #2  s: No complaints, Ambulating without complaints of dizziness or SOB.   O: VSS, Abd soft + BS Honeycomb dressing  Noted with small old drainage, dressing removed and replaced. Incision was noted to have staples intact. Small ecchymosis noted superior and inferior to incision Labs - Hgb 6.7, stable  A : post op day 2 post op myomectomy P: discharge instructions reviewed with patient. Written rx for Percocet given. Patient has samples of Niferex and postoperative check scheduled in office 5 days

## 2016-03-05 NOTE — Progress Notes (Signed)
Discharge to home  Teaching  Complete  Husband with pt  Out in wheelchair

## 2016-03-06 NOTE — Discharge Summary (Signed)
Discharge Summary Reason for Admission: uterine fibroids , menorrhagia and anemia  Procedures: myomectomy Complications-Operative : none Hemoglobin  Date Value Ref Range Status  03/05/2016 6.7 (LL) 12.0 - 15.0 g/dL Final    Comment:    REPEATED TO VERIFY CRITICAL RESULT CALLED TO, READ BACK BY AND VERIFIED WITH: K MCCARTY @0617  03/05/16 BY S GEZAHEGN    HGB  Date Value Ref Range Status  12/28/2015 10.5 (L) 11.6 - 15.9 g/dL Final   HCT  Date Value Ref Range Status  03/05/2016 21.0 (L) 36.0 - 46.0 % Final  12/28/2015 31.9 (L) 34.8 - 46.6 % Final    Physical Exam:  General: alert and cooperative Minimal vag bleeding Abd soft + BS Incision: healing well DVT Evaluation: No evidence of DVT seen on physical exam. Negative Homan's sign. No cords or calf tenderness. No significant calf/ankle edema.  Discharge Diagnoses: s/p myomectomy  Discharge Information: Date: 03/06/2016 Activity: pelvic rest Diet: routine Medications: Ibuprofen, Iron and Percocet Condition: stable Instructions: up as tolerated , no vag entry. Remove honeycomb dressing in 2 days. Call for fever, heavy bleeding,SOB or dizziness. Call office for appointment in 5 days to remove staples Discharge to: home  .  Trenita Hulme G 03/06/2016, 8:50 AM

## 2016-05-16 ENCOUNTER — Other Ambulatory Visit: Payer: Managed Care, Other (non HMO)

## 2016-05-16 ENCOUNTER — Ambulatory Visit: Payer: Managed Care, Other (non HMO) | Admitting: Hematology & Oncology

## 2018-06-10 ENCOUNTER — Emergency Department (HOSPITAL_COMMUNITY)
Admission: EM | Admit: 2018-06-10 | Discharge: 2018-06-10 | Disposition: A | Discharge status: Home / Self Care | Payer: PRIVATE HEALTH INSURANCE | Attending: Emergency Medicine | Admitting: Emergency Medicine

## 2018-06-10 ENCOUNTER — Encounter (HOSPITAL_COMMUNITY): Payer: Self-pay

## 2018-06-10 ENCOUNTER — Other Ambulatory Visit: Payer: Self-pay

## 2018-06-10 DIAGNOSIS — E039 Hypothyroidism, unspecified: Secondary | ICD-10-CM | POA: Diagnosis not present

## 2018-06-10 DIAGNOSIS — Y990 Civilian activity done for income or pay: Secondary | ICD-10-CM | POA: Insufficient documentation

## 2018-06-10 DIAGNOSIS — Z79899 Other long term (current) drug therapy: Secondary | ICD-10-CM | POA: Insufficient documentation

## 2018-06-10 DIAGNOSIS — Y9259 Other trade areas as the place of occurrence of the external cause: Secondary | ICD-10-CM | POA: Diagnosis not present

## 2018-06-10 DIAGNOSIS — S61212A Laceration without foreign body of right middle finger without damage to nail, initial encounter: Secondary | ICD-10-CM | POA: Insufficient documentation

## 2018-06-10 DIAGNOSIS — W268XXA Contact with other sharp object(s), not elsewhere classified, initial encounter: Secondary | ICD-10-CM | POA: Diagnosis not present

## 2018-06-10 DIAGNOSIS — I10 Essential (primary) hypertension: Secondary | ICD-10-CM | POA: Insufficient documentation

## 2018-06-10 DIAGNOSIS — Y9389 Activity, other specified: Secondary | ICD-10-CM | POA: Insufficient documentation

## 2018-06-10 MED ORDER — LIDOCAINE HCL 2 % IJ SOLN
10.0000 mL | Freq: Once | INTRAMUSCULAR | Status: AC
  Administered 2018-06-10: 200 mg
  Filled 2018-06-10: qty 20

## 2018-06-10 NOTE — ED Triage Notes (Signed)
Pt reports that she cut her R middle finger at work. She states that it has been bleeding steadily since 845. Bleeding controlled.

## 2018-06-10 NOTE — Discharge Instructions (Signed)
It was my pleasure taking care of you today!   Keep wound clean with mild soap and water. Keep area covered , keep bandage dry, and do not submerge in water tonight. Monitor area for signs of infection to include, but not limited to: increasing pain, spreading redness, drainage/pus, worsening swelling, or fevers. Return to emergency department for emergent changing or worsening symptoms.

## 2018-06-10 NOTE — ED Notes (Signed)
Lidocaine and suture tray at bedside.

## 2018-06-10 NOTE — ED Provider Notes (Signed)
[facility] DEPT Provider Note   CSN: [id number] Arrival date & time: 06/10/18  2213     History   Chief Complaint Chief Complaint  Patient presents with  . Laceration    HPI Anna Palmer is a 33 y.o. female.  The history is provided by the patient and medical records. No language interpreter was used.  Laceration   Anna Palmer is a 33 y.o. female who presents to the Emergency Department complaining of laceration to her right middle finger which occurred a few hours ago. 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  She tried to control the bleeding on her own, but states that it has just been oozing since she cut herself at 845.  Tetanus is up-to-date.  Not really painful.  Past Medical History:  Diagnosis Date  . Anemia   . Anxiety   . Depression   . Hypertension   . Hypothyroidism     Patient Active Problem List   Diagnosis Date Noted  . Fibroid uterus 03/03/2016    Past Surgical History:  Procedure Laterality Date  . MYOMECTOMY N/A 03/03/2016   Procedure: ABDOMINAL MYOMECTOMY;  Surgeon: Maisie Fus, MD;  Location: [facility];  Service: Gynecology;  Laterality: N/A;  . WISDOM TOOTH EXTRACTION       OB History   No obstetric history on file.      Home Medications    Prior to Admission medications   Medication Sig Start Date End Date Taking? Authorizing Provider  ibuprofen (ADVIL,MOTRIN) 200 MG tablet Take 3 tablets (600 mg total) by mouth every 6 (six) hours as needed for headache or moderate pain. 03/05/16   Juanda Chance, NP  levothyroxine (SYNTHROID, LEVOTHROID) 50 MCG tablet Take 50 mcg by mouth daily.    [provider]  lisinopril (PRINIVIL,ZESTRIL) 20 MG tablet Take 20 mg by mouth daily.    [provider]  PARoxetine (PAXIL) 40 MG tablet Take 40 mg by mouth every morning.    [provider]    Family History History reviewed. No pertinent  family history.  Social History Social History   Tobacco Use  . Smoking status: Current Every Day Smoker    Packs/day: 0.50    Years: 15.00    Pack years: 7.50    Types: Cigarettes  . Smokeless tobacco: Never Used  Substance Use Topics  . Alcohol use: Yes  . Drug use: No     Allergies   Feraheme [ferumoxytol]   Review of Systems Review of Systems  Musculoskeletal: Negative for arthralgias.  Skin: Positive for wound.  Neurological: Negative for weakness and numbness.     Physical Exam Updated Vital Signs BP (!) 144/94 (BP Location: Left Arm)   Pulse 66   Temp 98.8 F (37.1 C) (Oral)   Resp 16   SpO2 99%   Physical Exam Vitals signs and nursing note reviewed.  Constitutional:      General: She is not in acute distress.    Appearance: She is well-developed.  HENT:     Head: Normocephalic and atraumatic.  Neck:     Musculoskeletal: Neck supple.  Cardiovascular:     Rate and Rhythm: Normal rate and regular rhythm.     Heart sounds: Normal heart sounds. No murmur.  Pulmonary:     Effort: Pulmonary effort is normal. No respiratory distress.     Breath sounds: Normal breath sounds. No wheezing or rales.  Musculoskeletal: Normal range of motion.  Skin:    General: Skin is warm and dry.     Comments: 2 cm laceration to the right middle finger tip.  No nailbed involvement.  Good cap refill.  Sensation intact.  2+ radial pulse.  Neurological:     Mental Status: She is alert.      ED Treatments / Results  Labs (all labs ordered are listed, but only abnormal results are displayed) Labs Reviewed - No data to display  EKG None  Radiology No results found.  Procedures .Marland KitchenLaceration Repair Date/Time: 06/10/2018 11:32 PM Performed by: Ward, Ozella Almond, PA-C Authorized by: Ward, Ozella Almond, PA-C   Consent:    Consent obtained:  Verbal   Consent given by:  Patient   Risks discussed:  Pain, infection, poor cosmetic result and poor wound  healing Anesthesia (see MAR for exact dosages):    Anesthesia method:  Local infiltration   Local anesthetic:  Lidocaine 2% w/o epi Laceration details:    Location:  Finger   Finger location:  R long finger   Length (cm):  2 Pre-procedure details:    Preparation:  Patient was prepped and draped in usual sterile fashion Exploration:    Hemostasis achieved with:  Direct pressure   Wound exploration: wound explored through full range of motion and entire depth of wound probed and visualized   Treatment:    Area cleansed with:  Saline   Amount of cleaning:  Standard   Irrigation solution:  Sterile saline Skin repair:    Repair method:  Sutures   Suture size:  5-0   Wound skin closure material used: Vicryl Rapide.   Suture technique:  Simple interrupted   Number of sutures:  2 Approximation:    Approximation:  Close Post-procedure details:    Patient tolerance of procedure:  Tolerated well, no immediate complications   (including critical care time)  Medications Ordered in ED Medications  lidocaine (XYLOCAINE) 2 % (with pres) injection 200 mg (has no administration in time range)     Initial Impression / Assessment and Plan / ED Course  I have reviewed the triage vital signs and the nursing notes.  Pertinent labs & imaging results that were available during my care of the patient were reviewed by me and considered in my medical decision making (see chart for details).   Anna Palmer is a 33 y.o. female who presents to ED for laceration of small superficial laceration to the right fingertip. Wound thoroughly cleaned in ED today. Wound explored and bottom of wound seen in a bloodless field. Laceration repaired as dictated above. Patient counseled on home wound care. Patient was urged to return to the Emergency Department for worsening pain, swelling, expanding erythema especially if it streaks away from the affected area, fever, or for any additional concerns. Patient  verbalized understanding. All questions answered.   Final Clinical Impressions(s) / ED Diagnoses   Final diagnoses:  Laceration of right middle finger without foreign body without damage to nail, initial encounter    ED Discharge Orders    None       Ward, Ozella Almond, PA-C 06/10/18 2334    Quintella Reichert, MD 06/14/18 [phone]

## 2018-12-07 MED FILL — LABETALOL HCL 100MG TABLET: 100 | 135 days supply | Qty: 135 | Fill #0

## 2018-12-07 MED FILL — OMEPRAZOLE 40 MG CPDR: 40 | 30 days supply | Qty: 30 | Fill #0

## 2018-12-07 MED FILL — METOCLOPRAMIDE 10 MG TABLET: 10 | 30 days supply | Qty: 120 | Fill #0

## 2018-12-07 MED FILL — LEVOTHYROXINE 88 MCG TABLET: 88 | 90 days supply | Qty: 90 | Fill #0

## 2018-12-07 MED FILL — FLUoxetine HCL 20 MG CAPS: 20 | 90 days supply | Qty: 90 | Fill #0

## 2018-12-16 MED FILL — LEVOTHYROXINE 88 MCG TABLET: 88 | 90 days supply | Qty: 90 | Fill #0

## 2018-12-16 MED FILL — FLUoxetine HCL 20 MG CAPS: 20 | 90 days supply | Qty: 90 | Fill #0

## 2018-12-16 MED FILL — OMEPRAZOLE 40 MG CPDR: 40 | 30 days supply | Qty: 30 | Fill #0

## 2018-12-16 MED FILL — LABETALOL HCL 100MG TABLET: 100 | 90 days supply | Qty: 135 | Fill #0

## 2018-12-16 MED FILL — METOCLOPRAMIDE 10 MG TABLET: 10 | 30 days supply | Qty: 120 | Fill #0

## 2019-01-14 MED FILL — OMEPRAZOLE 40 MG CPDR: 40 | 30 days supply | Qty: 30 | Fill #1

## 2019-01-27 MED FILL — LEVOTHYROXINE 112 MCG TAB: 112 | 30 days supply | Qty: 30 | Fill #0

## 2019-02-22 MED FILL — OMEPRAZOLE 40 MG CPDR: 40 | 30 days supply | Qty: 30 | Fill #2

## 2019-03-05 MED FILL — LEVOTHYROXINE 112 MCG TAB: 112 | 30 days supply | Qty: 30 | Fill #1

## 2019-03-26 MED FILL — OMEPRAZOLE 40 MG CPDR: 40 | 30 days supply | Qty: 30 | Fill #3

## 2019-04-14 MED FILL — FLUoxetine HCL 20 MG CAPS: 20 | 90 days supply | Qty: 90 | Fill #1

## 2019-05-09 MED FILL — ONDANSETRON ODT 4 MG TABLET: 4 | 30 days supply | Qty: 30 | Fill #0

## 2019-05-17 MED FILL — OMEPRAZOLE 40 MG CPDR: 40 | 30 days supply | Qty: 30 | Fill #0

## 2019-05-24 NOTE — Pre-Procedure Instructions (Signed)
Your procedure is scheduled on Monday, January 18th, from 07:30 AM to 09:20 AM.  Report to [facility] Main Entrance "A" at 05:30 A.M., and check in at the Admitting office.  Call this number if you have problems the morning of surgery:  [phone]  Call [phone] if you have any questions prior to your surgery date Monday-Friday 8am-4pm    Remember:  Do not eat after midnight the night before your surgery.  You may drink clear liquids until 04:30 AM the morning of your surgery.   Clear liquids allowed are: Water, Non-Citrus Juices (without pulp), Carbonated Beverages, Clear Tea, Black Coffee Only, and Gatorade.   Please complete your PRE-SURGERY ENSURE that was provided to you by 04:30 AM the morning of surgery.  Please, if able, drink it in one setting. DO NOT SIP.   Take these medicines the morning of surgery with A SIP OF WATER : FLUoxetine (PROZAC) levothyroxine (SYNTHROID) omeprazole (PRILOSEC)  IF NEEDED: ALPRAZolam (XANAX); ondansetron (ZOFRAN); promethazine (PHENERGAN)   As of today, STOP taking any Aspirin (unless otherwise instructed by your surgeon), Aleve, Naproxen, Ibuprofen, Motrin, Advil, Goody's, BC's, all herbal medications, fish oil, and all vitamins.    The Morning of Surgery  Do not wear jewelry, make-up or nail polish.  Do not wear lotions, powders, perfumes, or deodorant  Do not shave 48 hours prior to surgery.    Do not bring valuables to the hospital.  [facility] is not responsible for any belongings or valuables.  If you are a smoker, DO NOT Smoke 24 hours prior to surgery  If you wear a CPAP at night please bring your mask, tubing, and machine the morning of surgery   Remember that you must have someone to transport you home after your surgery, and remain with you for 24 hours if you are discharged the same day.   Please bring cases for contacts, glasses, hearing aids, dentures or bridgework because it cannot be worn into surgery.     Leave your suitcase in the car.  After surgery it may be brought to your room.  For patients admitted to the hospital, discharge time will be determined by your treatment team.  Patients discharged the day of surgery will not be allowed to drive home.    Special instructions:   [facility]- Preparing For Surgery  Before surgery, you can play an important role. Because skin is not sterile, your skin needs to be as free of germs as possible. You can reduce the number of germs on your skin by washing with CHG (chlorahexidine gluconate) Soap before surgery.  CHG is an antiseptic cleaner which kills germs and bonds with the skin to continue killing germs even after washing.    Oral Hygiene is also important to reduce your risk of infection.  Remember - BRUSH YOUR TEETH THE MORNING OF SURGERY WITH YOUR REGULAR TOOTHPASTE  Please do not use if you have an allergy to CHG or antibacterial soaps. If your skin becomes reddened/irritated stop using the CHG.  Do not shave (including legs and underarms) for at least 48 hours prior to first CHG shower. It is OK to shave your face.  Please follow these instructions carefully.   1. Shower the NIGHT BEFORE SURGERY and the MORNING OF SURGERY with CHG Soap.   2. If you chose to wash your hair, wash your hair first as usual with your normal shampoo.  3. After you shampoo, rinse your hair and body thoroughly to remove the shampoo.  4. Use CHG as you would any other liquid soap. You can apply CHG directly to the skin and wash gently with a scrungie or a clean washcloth.   5. Apply the CHG Soap to your body ONLY FROM THE NECK DOWN.  Do not use on open wounds or open sores. Avoid contact with your eyes, ears, mouth and genitals (private parts). Wash Face and genitals (private parts)  with your normal soap.   6. Wash thoroughly, paying special attention to the area where your surgery will be performed.  7. Thoroughly rinse your body with warm water from  the neck down.  8. DO NOT shower/wash with your normal soap after using and rinsing off the CHG Soap.  9. Pat yourself dry with a CLEAN TOWEL.  10. Wear CLEAN PAJAMAS to bed the night before surgery, wear comfortable clothes the morning of surgery  11. Place CLEAN SHEETS on your bed the night of your first shower and DO NOT SLEEP WITH PETS.    Day of Surgery:   Remember to brush your teeth WITH YOUR REGULAR TOOTHPASTE. Please shower the morning of surgery with the CHG soap Do not apply any deodorants/lotions. Please wear clean clothes to the hospital/surgery center.      Please read over the following fact sheets that you were given.

## 2019-05-25 ENCOUNTER — Encounter (HOSPITAL_COMMUNITY): Payer: Self-pay

## 2019-05-25 ENCOUNTER — Encounter (HOSPITAL_COMMUNITY)
Admission: RE | Admit: 2019-05-25 | Discharge: 2019-05-25 | Disposition: A | Discharge status: Home / Self Care | Payer: No Typology Code available for payment source | Source: Ambulatory Visit | Attending: Obstetrics & Gynecology | Admitting: Obstetrics & Gynecology

## 2019-05-25 ENCOUNTER — Other Ambulatory Visit: Payer: Self-pay

## 2019-05-25 DIAGNOSIS — Z01818 Encounter for other preprocedural examination: Secondary | ICD-10-CM | POA: Insufficient documentation

## 2019-05-25 HISTORY — DX: Cyclical vomiting syndrome unrelated to migraine: R11.15

## 2019-05-25 LAB — COMPREHENSIVE METABOLIC PANEL
ALT: 18 U/L (ref 0–44)
AST: 15 U/L (ref 15–41)
Albumin: 4.2 g/dL (ref 3.5–5.0)
Alkaline Phosphatase: 80 U/L (ref 38–126)
Anion gap: 8 (ref 5–15)
BUN: 9 mg/dL (ref 6–20)
CO2: 24 mmol/L (ref 22–32)
Calcium: 9.5 mg/dL (ref 8.9–10.3)
Chloride: 106 mmol/L (ref 98–111)
Creatinine, Ser: 0.67 mg/dL (ref 0.44–1.00)
GFR calc Af Amer: >60 mL/min (ref >60)
GFR calc non Af Amer: >60 mL/min (ref >60)
Glucose, Bld: 97 mg/dL (ref 70–99)
Potassium: 4.4 mmol/L (ref 3.5–5.1)
Sodium: 138 mmol/L (ref 135–145)
Total Bilirubin: 0.9 mg/dL (ref 0.3–1.2)
Total Protein: 7.3 g/dL (ref 6.5–8.1)

## 2019-05-25 LAB — ABO/RH: ABO/RH(D): A POS

## 2019-05-25 LAB — CBC
HCT: 41.7 % (ref 36.0–46.0)
Hemoglobin: 13.5 g/dL (ref 12.0–15.0)
MCH: 28.7 pg (ref 26.0–34.0)
MCHC: 32.4 g/dL (ref 30.0–36.0)
MCV: 88.7 fL (ref 80.0–100.0)
Platelets: 260 10*3/uL (ref 150–400)
RBC: 4.7 MIL/uL (ref 3.87–5.11)
RDW: 13.2 % (ref 11.5–15.5)
WBC: 10 10*3/uL (ref 4.0–10.5)
nRBC: 0 % (ref 0.0–0.2)

## 2019-05-25 LAB — TYPE AND SCREEN
ABO/RH(D): A POS
Antibody Screen: NEGATIVE

## 2019-05-25 NOTE — Pre-Procedure Instructions (Signed)
Your procedure is scheduled on Monday, January 18th  Report to [facility] Main Entrance "A" at 05:30 A.M., and check in at the Admitting office.  Call this number if you have problems the morning of surgery:  [phone]  Call [phone] if you have any questions prior to your surgery date Monday-Friday 8am-4pm   Remember:  Do not eat after midnight the night before your surgery.  You may drink clear liquids until 04:30 AM the morning of your surgery.   Clear liquids allowed are: Water, Non-Citrus Juices (without pulp), Carbonated Beverages, Clear Tea, Black Coffee Only, and Gatorade.    Take these medicines the morning of surgery with A SIP OF WATER : FLUoxetine (PROZAC) levothyroxine (SYNTHROID) omeprazole (PRILOSEC)  IF NEEDED: ALPRAZolam (XANAX); ondansetron (ZOFRAN); promethazine (PHENERGAN)  As of today, STOP taking any Aspirin (unless otherwise instructed by your surgeon), Aleve, Naproxen, Ibuprofen, Motrin, Advil, Goody's, BC's, all herbal medications, fish oil, and all vitamins.    The Morning of Surgery  Do not wear jewelry, make-up or nail polish.  Do not wear lotions, powders, perfumes, or deodorant  Do not shave 48 hours prior to surgery.    Do not bring valuables to the hospital.  [facility] is not responsible for any belongings or valuables.  If you are a smoker, DO NOT Smoke 24 hours prior to surgery  If you wear a CPAP at night please bring your mask, tubing, and machine the morning of surgery   Remember that you must have someone to transport you home after your surgery, and remain with you for 24 hours if you are discharged the same day.  Please bring cases for contacts, glasses, hearing aids, dentures or bridgework because it cannot be worn into surgery.   Leave your suitcase in the car.  After surgery it may be brought to your room.  For patients admitted to the hospital, discharge time will be determined by your treatment team.  Patients discharged  the day of surgery will not be allowed to drive home.   Special instructions:   [place]- Preparing For Surgery  Before surgery, you can play an important role. Because skin is not sterile, your skin needs to be as free of germs as possible. You can reduce the number of germs on your skin by washing with CHG (chlorahexidine gluconate) Soap before surgery.  CHG is an antiseptic cleaner which kills germs and bonds with the skin to continue killing germs even after washing.    Oral Hygiene is also important to reduce your risk of infection.  Remember - BRUSH YOUR TEETH THE MORNING OF SURGERY WITH YOUR REGULAR TOOTHPASTE  Please do not use if you have an allergy to CHG or antibacterial soaps. If your skin becomes reddened/irritated stop using the CHG.  Do not shave (including legs and underarms) for at least 48 hours prior to first CHG shower. It is OK to shave your face.  Please follow these instructions carefully.   1. Shower the NIGHT BEFORE SURGERY and the MORNING OF SURGERY with CHG Soap.   2. If you chose to wash your hair, wash your hair first as usual with your normal shampoo.  3. After you shampoo, rinse your hair and body thoroughly to remove the shampoo.  4. Use CHG as you would any other liquid soap. You can apply CHG directly to the skin and wash gently with a scrungie or a clean washcloth.   5. Apply the CHG Soap to your body ONLY FROM THE  NECK DOWN.  Do not use on open wounds or open sores. Avoid contact with your eyes, ears, mouth and genitals (private parts). Wash Face and genitals (private parts)  with your normal soap.   6. Wash thoroughly, paying special attention to the area where your surgery will be performed.  7. Thoroughly rinse your body with warm water from the neck down.  8. DO NOT shower/wash with your normal soap after using and rinsing off the CHG Soap.  9. Pat yourself dry with a CLEAN TOWEL.  10. Wear CLEAN PAJAMAS to bed the night before surgery, wear  comfortable clothes the morning of surgery  11. Place CLEAN SHEETS on your bed the night of your first shower and DO NOT SLEEP WITH PETS.  Day of Surgery:   Remember to brush your teeth WITH YOUR REGULAR TOOTHPASTE. Please shower the morning of surgery with the CHG soap Do not apply any deodorants/lotions. Please wear clean clothes to the hospital/surgery center.    Please read over the following fact sheets that you were given.

## 2019-05-25 NOTE — Progress Notes (Signed)
PCP - Carol Ada MD  Chest x-ray - n/a EKG - 05/25/19 Stress Test - n/a ECHO - n/a Cardiac Cath - n/a   ERAS Protcol - yes - drink not ordered  COVID TEST- 05/26/19   Anesthesia review: no  Patient denies shortness of breath, fever, cough and chest pain at PAT appointment   All instructions explained to the patient, with a verbal understanding of the material. Patient agrees to go over the instructions while at home for a better understanding. Patient also instructed to self quarantine after being tested for COVID-19. The opportunity to ask questions was provided.

## 2019-05-26 ENCOUNTER — Other Ambulatory Visit: Payer: Self-pay | Admitting: *Deleted

## 2019-05-26 ENCOUNTER — Other Ambulatory Visit (HOSPITAL_COMMUNITY)
Admission: RE | Admit: 2019-05-26 | Discharge: 2019-05-26 | Disposition: A | Discharge status: Home / Self Care | Payer: No Typology Code available for payment source | Source: Ambulatory Visit | Attending: Obstetrics & Gynecology | Admitting: Obstetrics & Gynecology

## 2019-05-26 DIAGNOSIS — Z20822 Contact with and (suspected) exposure to covid-19: Secondary | ICD-10-CM | POA: Insufficient documentation

## 2019-05-26 DIAGNOSIS — Z01812 Encounter for preprocedural laboratory examination: Secondary | ICD-10-CM | POA: Diagnosis not present

## 2019-05-26 NOTE — Patient Outreach (Signed)
[facility]) Care Management  05/26/2019  Anna Palmer [birth year] [id number]   Preoperative Screening Call Referral received: 05/23/19 Surgery/Procedure date: 05/30/19` Initial outreach: 05/26/19 Insurance: [employer]   Initial unsuccessful telephone call to patient's preferred number in order to complete  preoperative screening; no answer, left HIPAA compliant voicemail message requesting return call.  Objective: Per chart review, patient scheduled for Total Hysterectomy on 05/30/19  at [facility]. She  completed pre-op testing on 05/25/19. Plan: If patient does not return call today, this RNCM will call patient for transition of care outreach within 72 hours of hospital discharge notification.  Joylene Draft, RN, [place] Management Coordinator  [phone]- Mobile [phone]- Toll Free Main Office

## 2019-05-27 LAB — NOVEL CORONAVIRUS, NAA (HOSP ORDER, SEND-OUT TO REF LAB; TAT 18-24 HRS): SARS-CoV-2, NAA: NOT DETECTED

## 2019-05-27 NOTE — H&P (Signed)
Anna Palmer is an 34 y.o. female with h/o symptomatic uterine fibroids.  She experiences heavy and painful menses.  She has h/o previous myomectomy in 2017 with rapid recurrence.  She recently was pregnant and her pregnancy was complicated because of fibroids and required delivery at [facility].  She has definitively completed childbearing.  She declines hormonal tx and UFE.  Patient whishes to proceed with hysterectomy.  Currently, uterus is 20 weeks sized.  Pertinent Gynecological History: Menses: flow is excessive with use of 8 pads or tampons on heaviest days Bleeding: regular Contraception: Depo-Provera injections DES exposure: unknown Blood transfusions: none Sexually transmitted diseases: no past history Previous GYN Procedures: myomectomy  Last mammogram: n/a Date: n/a Last pap: normal Date: 2019 OB History: G1, P1001  Menstrual History: Menarche age: n/a No LMP recorded.    Past Medical History:  Diagnosis Date  . Anemia   . Anxiety   . Cyclic vomiting syndrome    per pt 05/25/19  . Depression   . Hypertension   . Hypothyroidism     Past Surgical History:  Procedure Laterality Date  . MYOMECTOMY N/A 03/03/2016   Procedure: ABDOMINAL MYOMECTOMY;  Surgeon: Anna Fus, MD;  Location: [facility];  Service: Gynecology;  Laterality: N/A;  . WISDOM TOOTH EXTRACTION      No family history on file.  Social History:  reports that she quit smoking about 12 months ago. Her smoking use included cigarettes. She has a 7.50 pack-year smoking history. She has never used smokeless tobacco. She reports current alcohol use of about 1.0 standard drinks of alcohol per week. She reports that she does not use drugs.  Allergies:  Allergies  Allergen Reactions  . Feraheme [Ferumoxytol] Other (See Comments)    Diaphotetic, passed out, short of breath, seizure like     No medications prior to admission.    Review of Systems  There were no vitals taken for this visit. Physical  Exam  Constitutional: She is oriented to person, place, and time. She appears well-developed and well-nourished.  Cardiovascular: Normal rate and regular rhythm.  Respiratory: Effort normal and breath sounds normal.  GI: Soft. There is no rebound and no guarding.  Musculoskeletal:        General: Normal range of motion.  Neurological: She is alert and oriented to person, place, and time.  Skin: Skin is warm and dry.  Psychiatric: She has a normal mood and affect. Her behavior is normal.    Results for orders placed or performed during the hospital encounter of 05/26/19 (from the past 24 hour(s))  Novel Coronavirus, NAA (Hosp order, Send-out to Ref Lab; TAT 18-24 hrs     Status: None   Collection Time: 05/26/19  2:37 PM   Specimen: Nasopharyngeal Swab; Respiratory  Result Value Ref Range   SARS-CoV-2, NAA NOT DETECTED NOT DETECTED   Coronavirus Source NASOPHARYNGEAL     No results found.  Assessment/Plan: 34yo G1P1001 with symptomatic uterine fibroids -TAH, bilateral salpingectomy -Patient is counseled re: risk of bleeding, infection, scarring, and damage to surrounding structures.  She understands that this will complete her ability to bear children.  She understands the steps of the procedure, hospitalization, and postop expectations.  All questions were answered and the patient wishes to proceed.  Anna Palmer 05/27/2019, 1:35 PM

## 2019-05-30 ENCOUNTER — Other Ambulatory Visit: Payer: Self-pay

## 2019-05-30 ENCOUNTER — Inpatient Hospital Stay (HOSPITAL_COMMUNITY)
Admission: RE | Admit: 2019-05-30 | Discharge: 2019-06-01 | DRG: 743 | Disposition: A | Discharge status: Home / Self Care | Payer: No Typology Code available for payment source | Attending: Obstetrics & Gynecology | Admitting: Obstetrics & Gynecology

## 2019-05-30 ENCOUNTER — Observation Stay (HOSPITAL_COMMUNITY): Payer: No Typology Code available for payment source | Admitting: Anesthesiology

## 2019-05-30 ENCOUNTER — Encounter (HOSPITAL_COMMUNITY)
Admission: RE | Disposition: A | Discharge status: Home / Self Care | Payer: Self-pay | Source: Home / Self Care | Attending: Obstetrics & Gynecology

## 2019-05-30 ENCOUNTER — Encounter (HOSPITAL_COMMUNITY): Payer: Self-pay | Admitting: Obstetrics & Gynecology

## 2019-05-30 ENCOUNTER — Observation Stay (HOSPITAL_COMMUNITY): Payer: No Typology Code available for payment source | Admitting: Vascular Surgery

## 2019-05-30 DIAGNOSIS — Z87891 Personal history of nicotine dependence: Secondary | ICD-10-CM | POA: Diagnosis not present

## 2019-05-30 DIAGNOSIS — Z20822 Contact with and (suspected) exposure to covid-19: Secondary | ICD-10-CM | POA: Diagnosis present

## 2019-05-30 DIAGNOSIS — D219 Benign neoplasm of connective and other soft tissue, unspecified: Secondary | ICD-10-CM | POA: Diagnosis present

## 2019-05-30 DIAGNOSIS — Z79890 Hormone replacement therapy (postmenopausal): Secondary | ICD-10-CM | POA: Diagnosis not present

## 2019-05-30 DIAGNOSIS — F329 Major depressive disorder, single episode, unspecified: Secondary | ICD-10-CM | POA: Diagnosis present

## 2019-05-30 DIAGNOSIS — E039 Hypothyroidism, unspecified: Secondary | ICD-10-CM | POA: Diagnosis present

## 2019-05-30 DIAGNOSIS — F419 Anxiety disorder, unspecified: Secondary | ICD-10-CM | POA: Diagnosis present

## 2019-05-30 DIAGNOSIS — N920 Excessive and frequent menstruation with regular cycle: Secondary | ICD-10-CM | POA: Diagnosis present

## 2019-05-30 DIAGNOSIS — Z888 Allergy status to other drugs, medicaments and biological substances status: Secondary | ICD-10-CM | POA: Diagnosis not present

## 2019-05-30 DIAGNOSIS — D259 Leiomyoma of uterus, unspecified: Principal | ICD-10-CM | POA: Diagnosis present

## 2019-05-30 DIAGNOSIS — I10 Essential (primary) hypertension: Secondary | ICD-10-CM | POA: Diagnosis present

## 2019-05-30 DIAGNOSIS — Z90710 Acquired absence of both cervix and uterus: Secondary | ICD-10-CM | POA: Diagnosis present

## 2019-05-30 HISTORY — PX: ABDOMINAL HYSTERECTOMY: SHX81

## 2019-05-30 LAB — POCT PREGNANCY, URINE: Preg Test, Ur: NEGATIVE

## 2019-05-30 SURGERY — HYSTERECTOMY, ABDOMINAL
Anesthesia: General | Site: Abdomen | Laterality: Bilateral

## 2019-05-30 MED ORDER — HYDROMORPHONE HCL 1 MG/ML IJ SOLN
INTRAMUSCULAR | Status: AC
  Administered 2019-05-30: 0.5 mg via INTRAVENOUS
  Filled 2019-05-30: qty 1

## 2019-05-30 MED ORDER — DIPHENHYDRAMINE HCL 50 MG/ML IJ SOLN
INTRAMUSCULAR | Status: DC | PRN
  Administered 2019-05-30: 12.5 mg via INTRAVENOUS

## 2019-05-30 MED ORDER — LACTATED RINGERS IV SOLN: INTRAVENOUS | Status: DC

## 2019-05-30 MED ORDER — ONDANSETRON HCL 4 MG/2ML IJ SOLN
INTRAMUSCULAR | Status: AC
  Filled 2019-05-30: qty 2

## 2019-05-30 MED ORDER — ROCURONIUM BROMIDE 10 MG/ML (PF) SYRINGE
PREFILLED_SYRINGE | INTRAVENOUS | Status: AC
  Filled 2019-05-30: qty 10

## 2019-05-30 MED ORDER — DEXAMETHASONE SODIUM PHOSPHATE 10 MG/ML IJ SOLN
INTRAMUSCULAR | Status: AC
  Filled 2019-05-30: qty 1

## 2019-05-30 MED ORDER — SUCCINYLCHOLINE CHLORIDE 200 MG/10ML IV SOSY
PREFILLED_SYRINGE | INTRAVENOUS | Status: DC | PRN
  Administered 2019-05-30: 100 mg via INTRAVENOUS

## 2019-05-30 MED ORDER — PROPOFOL 1000 MG/100ML IV EMUL
INTRAVENOUS | Status: AC
  Filled 2019-05-30: qty 100

## 2019-05-30 MED ORDER — PROMETHAZINE HCL 25 MG/ML IJ SOLN
6.2500 mg | Freq: Once | INTRAMUSCULAR | Status: AC
  Administered 2019-05-30: 6.25 mg via INTRAVENOUS

## 2019-05-30 MED ORDER — PROPOFOL 10 MG/ML IV BOLUS
INTRAVENOUS | Status: AC
  Filled 2019-05-30: qty 40

## 2019-05-30 MED ORDER — LIDOCAINE 2% (20 MG/ML) 5 ML SYRINGE
INTRAMUSCULAR | Status: AC
  Filled 2019-05-30: qty 5

## 2019-05-30 MED ORDER — SUCCINYLCHOLINE CHLORIDE 200 MG/10ML IV SOSY
PREFILLED_SYRINGE | INTRAVENOUS | Status: AC
  Filled 2019-05-30: qty 10

## 2019-05-30 MED ORDER — ONDANSETRON HCL 4 MG/2ML IJ SOLN
4.0000 mg | Freq: Once | INTRAMUSCULAR | Status: AC | PRN
  Administered 2019-05-30: 4 mg via INTRAVENOUS

## 2019-05-30 MED ORDER — MEPERIDINE HCL 25 MG/ML IJ SOLN: 6.2500 mg | INTRAMUSCULAR | Status: DC | PRN

## 2019-05-30 MED ORDER — DEXAMETHASONE SODIUM PHOSPHATE 10 MG/ML IJ SOLN
INTRAMUSCULAR | Status: DC | PRN
  Administered 2019-05-30: 5 mg via INTRAVENOUS

## 2019-05-30 MED ORDER — MIDAZOLAM HCL 2 MG/2ML IJ SOLN
INTRAMUSCULAR | Status: AC
  Filled 2019-05-30: qty 2

## 2019-05-30 MED ORDER — FENTANYL CITRATE (PF) 250 MCG/5ML IJ SOLN
INTRAMUSCULAR | Status: AC
  Filled 2019-05-30: qty 5

## 2019-05-30 MED ORDER — FLUOXETINE HCL 20 MG PO CAPS
20.0000 mg | ORAL_CAPSULE | Freq: Every day | ORAL | Status: DC
  Administered 2019-05-31 – 2019-06-01 (×2): 20 mg via ORAL
  Filled 2019-05-30 (×3): qty 1

## 2019-05-30 MED ORDER — SCOPOLAMINE 1 MG/3DAYS TD PT72
MEDICATED_PATCH | TRANSDERMAL | Status: AC
  Filled 2019-05-30: qty 1

## 2019-05-30 MED ORDER — ACETAMINOPHEN 325 MG PO TABS: 650.0000 mg | ORAL_TABLET | ORAL | Status: DC | PRN

## 2019-05-30 MED ORDER — PROMETHAZINE HCL 25 MG/ML IJ SOLN
INTRAMUSCULAR | Status: AC
  Filled 2019-05-30: qty 1

## 2019-05-30 MED ORDER — ONDANSETRON HCL 4 MG/2ML IJ SOLN
4.0000 mg | Freq: Four times a day (QID) | INTRAMUSCULAR | Status: DC | PRN
  Administered 2019-05-30: 4 mg via INTRAVENOUS
  Filled 2019-05-30: qty 2

## 2019-05-30 MED ORDER — KETOROLAC TROMETHAMINE 15 MG/ML IJ SOLN
15.0000 mg | Freq: Four times a day (QID) | INTRAMUSCULAR | Status: DC
  Administered 2019-05-30 – 2019-05-31 (×5): 15 mg via INTRAVENOUS
  Filled 2019-05-30 (×5): qty 1

## 2019-05-30 MED ORDER — CEFAZOLIN SODIUM-DEXTROSE 2-4 GM/100ML-% IV SOLN
2.0000 g | INTRAVENOUS | Status: AC
  Administered 2019-05-30: 2 g via INTRAVENOUS
  Filled 2019-05-30: qty 100

## 2019-05-30 MED ORDER — CHLORHEXIDINE GLUCONATE CLOTH 2 % EX PADS
6.0000 | MEDICATED_PAD | Freq: Every day | CUTANEOUS | Status: DC
  Administered 2019-05-30 – 2019-05-31 (×2): 6 via TOPICAL

## 2019-05-30 MED ORDER — MENTHOL 3 MG MT LOZG: 1.0000 | LOZENGE | OROMUCOSAL | Status: DC | PRN

## 2019-05-30 MED ORDER — DIPHENHYDRAMINE HCL 50 MG/ML IJ SOLN
INTRAMUSCULAR | Status: AC
  Filled 2019-05-30: qty 1

## 2019-05-30 MED ORDER — ROCURONIUM BROMIDE 10 MG/ML (PF) SYRINGE
PREFILLED_SYRINGE | INTRAVENOUS | Status: DC | PRN
  Administered 2019-05-30: 50 mg via INTRAVENOUS
  Administered 2019-05-30: 20 mg via INTRAVENOUS

## 2019-05-30 MED ORDER — MIDAZOLAM HCL 2 MG/2ML IJ SOLN
INTRAMUSCULAR | Status: DC | PRN
  Administered 2019-05-30: 2 mg via INTRAVENOUS

## 2019-05-30 MED ORDER — ONDANSETRON HCL 4 MG PO TABS: 4.0000 mg | ORAL_TABLET | Freq: Four times a day (QID) | ORAL | Status: DC | PRN

## 2019-05-30 MED ORDER — HYDROMORPHONE HCL 1 MG/ML IJ SOLN
0.2000 mg | INTRAMUSCULAR | Status: DC | PRN
  Administered 2019-05-30: 0.5 mg via INTRAVENOUS
  Administered 2019-05-30: 0.6 mg via INTRAVENOUS
  Filled 2019-05-30 (×2): qty 1

## 2019-05-30 MED ORDER — PANTOPRAZOLE SODIUM 40 MG PO TBEC
40.0000 mg | DELAYED_RELEASE_TABLET | Freq: Every day | ORAL | Status: DC
  Administered 2019-05-31 – 2019-06-01 (×2): 40 mg via ORAL
  Filled 2019-05-30 (×2): qty 1

## 2019-05-30 MED ORDER — DOCUSATE SODIUM 100 MG PO CAPS
100.0000 mg | ORAL_CAPSULE | Freq: Two times a day (BID) | ORAL | Status: DC
  Administered 2019-05-31 – 2019-06-01 (×3): 100 mg via ORAL
  Filled 2019-05-30 (×3): qty 1

## 2019-05-30 MED ORDER — SIMETHICONE 80 MG PO CHEW: 80.0000 mg | CHEWABLE_TABLET | Freq: Four times a day (QID) | ORAL | Status: DC | PRN

## 2019-05-30 MED ORDER — OXYCODONE-ACETAMINOPHEN 5-325 MG PO TABS
1.0000 | ORAL_TABLET | ORAL | Status: DC | PRN
  Administered 2019-05-30 – 2019-05-31 (×5): 2 via ORAL
  Administered 2019-05-31: 1 via ORAL
  Administered 2019-05-31 – 2019-06-01 (×3): 2 via ORAL
  Filled 2019-05-30 (×8): qty 2
  Filled 2019-05-30: qty 1

## 2019-05-30 MED ORDER — LEVOTHYROXINE SODIUM 88 MCG PO TABS
88.0000 ug | ORAL_TABLET | Freq: Every day | ORAL | Status: DC
  Administered 2019-05-31 – 2019-06-01 (×2): 88 ug via ORAL
  Filled 2019-05-30 (×2): qty 1

## 2019-05-30 MED ORDER — FENTANYL CITRATE (PF) 250 MCG/5ML IJ SOLN
INTRAMUSCULAR | Status: DC | PRN
  Administered 2019-05-30 (×2): 50 ug via INTRAVENOUS
  Administered 2019-05-30: 100 ug via INTRAVENOUS
  Administered 2019-05-30: 50 ug via INTRAVENOUS

## 2019-05-30 MED ORDER — HYDROMORPHONE HCL 1 MG/ML IJ SOLN
0.2500 mg | INTRAMUSCULAR | Status: DC | PRN
  Administered 2019-05-30 (×2): 0.5 mg via INTRAVENOUS

## 2019-05-30 MED ORDER — LIDOCAINE 2% (20 MG/ML) 5 ML SYRINGE
INTRAMUSCULAR | Status: DC | PRN
  Administered 2019-05-30: 60 mg via INTRAVENOUS

## 2019-05-30 MED ORDER — ONDANSETRON HCL 4 MG/2ML IJ SOLN
4.0000 mg | Freq: Once | INTRAMUSCULAR | Status: AC
  Administered 2019-05-30: 4 mg via INTRAVENOUS

## 2019-05-30 MED ORDER — SUGAMMADEX SODIUM 200 MG/2ML IV SOLN
INTRAVENOUS | Status: DC | PRN
  Administered 2019-05-30: 200 mg via INTRAVENOUS

## 2019-05-30 MED ORDER — SCOPOLAMINE 1 MG/3DAYS TD PT72
MEDICATED_PATCH | TRANSDERMAL | Status: DC | PRN
  Administered 2019-05-30: 1 via TRANSDERMAL

## 2019-05-30 MED ORDER — PROPOFOL 10 MG/ML IV BOLUS
INTRAVENOUS | Status: DC | PRN
  Administered 2019-05-30: 150 mg via INTRAVENOUS

## 2019-05-30 SURGICAL SUPPLY — 42 items
APL SKNCLS STERI-STRIP NONHPOA (GAUZE/BANDAGES/DRESSINGS) ×1
BENZOIN TINCTURE PRP APPL 2/3 (GAUZE/BANDAGES/DRESSINGS) ×2 IMPLANT
CANISTER SUCT 3000ML PPV (MISCELLANEOUS) ×2 IMPLANT
CELLS DAT CNTRL 66122 CELL SVR (MISCELLANEOUS) IMPLANT
COVER WAND RF STERILE (DRAPES) ×2 IMPLANT
DRAPE WARM FLUID 44X44 (DRAPES) IMPLANT
DRSG OPSITE POSTOP 4X10 (GAUZE/BANDAGES/DRESSINGS) ×2 IMPLANT
DURAPREP 26ML APPLICATOR (WOUND CARE) ×2 IMPLANT
GAUZE 4X4 16PLY RFD (DISPOSABLE) ×2 IMPLANT
GLOVE BIO SURGEON STRL SZ 6 (GLOVE) ×2 IMPLANT
GLOVE BIOGEL PI IND STRL 6 (GLOVE) ×1 IMPLANT
GLOVE BIOGEL PI IND STRL 7.0 (GLOVE) ×2 IMPLANT
GLOVE BIOGEL PI INDICATOR 6 (GLOVE) ×1
GLOVE BIOGEL PI INDICATOR 7.0 (GLOVE) ×2
GOWN STRL REUS W/ TWL LRG LVL3 (GOWN DISPOSABLE) ×3 IMPLANT
GOWN STRL REUS W/TWL LRG LVL3 (GOWN DISPOSABLE) ×6
HEMOSTAT SURGICEL 4X8 (HEMOSTASIS) IMPLANT
HIBICLENS CHG 4% 4OZ BTL (MISCELLANEOUS) ×2 IMPLANT
KIT TURNOVER KIT B (KITS) ×2 IMPLANT
NS IRRIG 1000ML POUR BTL (IV SOLUTION) ×2 IMPLANT
PACK ABDOMINAL GYN (CUSTOM PROCEDURE TRAY) ×2 IMPLANT
PAD ARMBOARD 7.5X6 YLW CONV (MISCELLANEOUS) ×4 IMPLANT
PAD OB MATERNITY 4.3X12.25 (PERSONAL CARE ITEMS) ×2 IMPLANT
RETRACTOR WND ALEXIS 18 MED (MISCELLANEOUS) IMPLANT
RTRCTR WOUND ALEXIS 18CM MED (MISCELLANEOUS)
SPECIMEN JAR MEDIUM (MISCELLANEOUS) ×2 IMPLANT
SPONGE LAP 18X18 RF (DISPOSABLE) ×2 IMPLANT
STRIP CLOSURE SKIN 1/2X4 (GAUZE/BANDAGES/DRESSINGS) ×2 IMPLANT
SUT MNCRL 0 MO-4 VIOLET 18 CR (SUTURE) ×2 IMPLANT
SUT MNCRL 0 VIOLET 6X18 (SUTURE) ×1 IMPLANT
SUT MON AB 2-0 CT1 36 (SUTURE) IMPLANT
SUT MON AB-0 CT1 36 (SUTURE) ×4 IMPLANT
SUT MONOCRYL 0 6X18 (SUTURE) ×1
SUT MONOCRYL 0 MO 4 18  CR/8 (SUTURE) ×3
SUT PDS AB 0 CT1 27 (SUTURE) IMPLANT
SUT PLAIN 2 0 XLH (SUTURE) IMPLANT
SUT VIC AB 0 CT1 27 (SUTURE) ×4
SUT VIC AB 0 CT1 27XBRD ANBCTR (SUTURE) ×2 IMPLANT
SUT VIC AB 3-0 X1 27 (SUTURE) IMPLANT
SUT VIC AB 4-0 KS 27 (SUTURE) ×2 IMPLANT
TOWEL GREEN STERILE FF (TOWEL DISPOSABLE) ×4 IMPLANT
TRAY FOLEY W/BAG SLVR 14FR (SET/KITS/TRAYS/PACK) ×2 IMPLANT

## 2019-05-30 NOTE — Progress Notes (Signed)
Day of Surgery Procedure(s) (LRB): HYSTERECTOMY ABDOMINAL WITH SALPINGECTOMY (Bilateral)  Subjective: Patient reports incisional pain.  No CP/SOB.   Objective: I have reviewed patient's vital signs, intake and output and medications.  General: alert, cooperative and appears stated age GI: incision: clean and dry Extremities: extremities normal, atraumatic, no cyanosis or edema  Assessment: s/p Procedure(s): HYSTERECTOMY ABDOMINAL WITH SALPINGECTOMY (Bilateral): stable  Plan: Advance diet Encourage ambulation Advance to PO medication Clear liquids  D/C Foley and IVF in AM AM labs pending   LOS: 0 days    [name] 05/30/2019, 2:06 PM

## 2019-05-30 NOTE — Anesthesia Preprocedure Evaluation (Signed)
Anesthesia Evaluation  Patient identified by MRN, date of birth, ID band Patient awake    Reviewed: Allergy & Precautions, NPO status , Patient's Chart, lab work & pertinent test results  Airway Mallampati: I  TM Distance: >3 FB Neck ROM: Full    Dental   Pulmonary former smoker,    Pulmonary exam normal        Cardiovascular hypertension, Pt. on medications Normal cardiovascular exam     Neuro/Psych Anxiety Depression    GI/Hepatic   Endo/Other    Renal/GU      Musculoskeletal   Abdominal   Peds  Hematology   Anesthesia Other Findings   Reproductive/Obstetrics                             Anesthesia Physical Anesthesia Plan  ASA: II  Anesthesia Plan: General   Post-op Pain Management:    Induction: Intravenous  PONV Risk Score and Plan: 3 and Ondansetron, Midazolam and Dexamethasone  Airway Management Planned: Oral ETT  Additional Equipment:   Intra-op Plan:   Post-operative Plan: Extubation in OR  Informed Consent: I have reviewed the patients History and Physical, chart, labs and discussed the procedure including the risks, benefits and alternatives for the proposed anesthesia with the patient or authorized representative who has indicated his/her understanding and acceptance.       Plan Discussed with: CRNA and Surgeon  Anesthesia Plan Comments:         Anesthesia Quick Evaluation

## 2019-05-30 NOTE — Anesthesia Procedure Notes (Signed)
Procedure Name: Intubation Date/Time: 05/30/2019 7:33 AM Performed by: Alain Marion, CRNA Pre-anesthesia Checklist: Patient identified, Emergency Drugs available, Suction available and Patient being monitored Patient Re-evaluated:Patient Re-evaluated prior to induction Oxygen Delivery Method: Circle System Utilized Preoxygenation: Pre-oxygenation with 100% oxygen Induction Type: IV induction and Rapid sequence Laryngoscope Size: Miller and 2 Grade View: Grade I Tube type: Oral Tube size: 7.0 mm Number of attempts: 1 Airway Equipment and Method: Stylet Placement Confirmation: ETT inserted through vocal cords under direct vision,  positive ETCO2 and breath sounds checked- equal and bilateral Secured at: 21 cm Tube secured with: Tape Dental Injury: Teeth and Oropharynx as per pre-operative assessment

## 2019-05-30 NOTE — Op Note (Signed)
Anna Palmer PROCEDURE DATE: 05/30/2019  PREOPERATIVE DIAGNOSIS:  Symptomatic fibroids, menorrhagia POSTOPERATIVE DIAGNOSIS:  Symptomatic fibroids, menorrhagia SURGEON:  Dr. [name] ASSISTANT:  Dr. Dian Queen OPERATION:  Total abdominal hysterectomy, bilateral salpingectomy ANESTHESIA:  General endotracheal.  INDICATIONS: The patient is a 34 y.o. with history of symptomatic uterine fibroids/menorrhagia. The patient made a decision to undergo definite surgical treatment. On the preoperative visit, the risks, benefits, indications, and alternatives of the procedure were reviewed with the patient.  On the day of surgery, the risks of surgery were again discussed with the patient including but not limited to: bleeding which may require transfusion or reoperation; infection which may require antibiotics; injury to bowel, bladder, ureters or other surrounding organs; need for additional procedures; thromboembolic phenomenon, incisional problems and other postoperative/anesthesia complications. Written informed consent was obtained.    OPERATIVE FINDINGS: An 18 week size uterus with normal tubes and ovaries bilaterally.  ESTIMATED BLOOD LOSS: 200 ml SPECIMENS:  Uterus and fallopian tubes sent to pathology COMPLICATIONS:  None immediate.   DESCRIPTION OF PROCEDURE: The patient received intravenous antibiotics and had sequential compression devices applied to her lower extremities while in the preoperative area.   She was taken to the operating room and placed under general anesthesia without difficulty and found to be adequate.The abdomen and perineum were prepped and draped in a sterile manner, and a Foley catheter was inserted into the bladder and attached to constant drainage. After an adequate timeout was performed, a Pfannensteil skin incision was made. This incision was taken down to the fascia using electrocautery with care given to maintain good hemostasis. The fascia was incised in  the midline and the fascial incision was then extended bilaterally using electrocautery without difficulty. The fascia was then dissected off the underlying rectus muscles using blunt and sharp dissection. The rectus muscles were split bluntly in the midline and the peritoneum entered sharply without complication. This peritoneal incision was then extended superiorly and inferiorly with care given to prevent bowel or bladder injury. Upon entry into the abdominal cavity, the upper abdomen was inspected and found to be normal. Attention was then turned to the pelvis. The uterus at this point was noted to be mobilized. The round ligaments on each side were clamped, suture ligated, and transected with electrocautery allowing entry into the broad ligament. The anterior and posterior leaves of the broad ligament were separated and the ureters were inspected to be safely away from the area of dissection bilaterally. A hole was created in the clear portion of the posterior broad ligament. Adnexae were clamped on the patient's right side. This pedicle was then clamped, cut, and doubly suture ligated with good hemostasis. This procedure was repeated in an identical fashion on the left site allowing for both adnexa to remain in place.  Each fallopian tube was then clamped and excised.  The pedicle was suture ligated.  A bladder flap was then created across the anterior leaf of the broad ligament and the bladder was then bluntly dissected off the lower uterine segment and cervix with good hemostasis. The uterine arteries were then skeletonized bilaterally and then clamped, cut, and ligated with care given to prevent ureteral injury. The uterosacral ligaments were then clamped, cut, and ligated bilaterally.  Finally, the cardinal ligaments were clamped, cut, and ligated bilaterally.  Acutely curved clamps were placed across the vagina just under the cervix, and the specimen was amputated and sent to pathology. The vaginal cuff  was closed with a series of interrupted 0  Monocryl figure-of-eight sutures with care given to incorporate the anterior pubocervical fascia and the posterior rectovaginal fascia.  The vaginal cuff angles were noted to have good hemostasis.  The pelvis was irrigated and hemostasis was reconfirmed at all pedicles and along the pelvic sidewall.  All laparotomy sponges and instruments were removed from the abdomen. The peritoneum was closed with 2-0 Vicryl, and the fascia was closed with 0 PDS in a running fashion. The skin was closed with a 4-0 Vicryl subcuticular stitch. Sponge, lap, needle, and instrument counts were correct times two. The patient was taken to the recovery area awake, extubated and in stable condition.

## 2019-05-30 NOTE — Anesthesia Postprocedure Evaluation (Signed)
Anesthesia Post Note  Patient: Anna Palmer  Procedure(s) Performed: HYSTERECTOMY ABDOMINAL WITH SALPINGECTOMY (Bilateral Abdomen)     Patient location during evaluation: PACU Anesthesia Type: General Level of consciousness: awake and alert Pain management: pain level controlled Vital Signs Assessment: post-procedure vital signs reviewed and stable Respiratory status: spontaneous breathing, nonlabored ventilation, respiratory function stable and patient connected to nasal cannula oxygen Cardiovascular status: blood pressure returned to baseline and stable Postop Assessment: no apparent nausea or vomiting Anesthetic complications: no    Last Vitals:  Vitals:   05/30/19 1351 05/30/19 1426  BP: (!) 151/93   Pulse: (!) 57   Resp:    Temp:  37.1 C  SpO2:      Last Pain:  Vitals:   05/30/19 1455  TempSrc:   PainSc: [place] DAVID

## 2019-05-30 NOTE — Progress Notes (Signed)
No change to H&P.  Anabia Weatherwax, DO 

## 2019-05-30 NOTE — Transfer of Care (Signed)
Immediate Anesthesia Transfer of Care Note  Patient: Anna Palmer  Procedure(s) Performed: HYSTERECTOMY ABDOMINAL WITH SALPINGECTOMY (Bilateral Abdomen)  Patient Location: PACU  Anesthesia Type:General  Level of Consciousness: awake, alert  and oriented  Airway & Oxygen Therapy: Patient Spontanous Breathing and Patient connected to face mask oxygen  Post-op Assessment: Report given to RN and Post -op Vital signs reviewed and stable  Post vital signs: Reviewed and stable  Last Vitals:  Vitals Value Taken Time  BP 147/86 05/30/19 0905  Temp 36.1 C 05/30/19 0905  Pulse 95 05/30/19 0908  Resp 23 05/30/19 0908  SpO2 100 % 05/30/19 0908  Vitals shown include unvalidated device data.  Last Pain:  Vitals:   05/30/19 0610  TempSrc: Oral  PainSc: 2          Complications: No apparent anesthesia complications

## 2019-05-31 LAB — CBC
HCT: 34 % — ABNORMAL LOW (ref 36.0–46.0)
Hemoglobin: 11.5 g/dL — ABNORMAL LOW (ref 12.0–15.0)
MCH: 28.8 pg (ref 26.0–34.0)
MCHC: 33.8 g/dL (ref 30.0–36.0)
MCV: 85.2 fL (ref 80.0–100.0)
Platelets: 272 10*3/uL (ref 150–400)
RBC: 3.99 MIL/uL (ref 3.87–5.11)
RDW: 13.2 % (ref 11.5–15.5)
WBC: 13.6 10*3/uL — ABNORMAL HIGH (ref 4.0–10.5)
nRBC: 0 % (ref 0.0–0.2)

## 2019-05-31 LAB — COMPREHENSIVE METABOLIC PANEL
ALT: 13 U/L (ref 0–44)
AST: 12 U/L — ABNORMAL LOW (ref 15–41)
Albumin: 3.4 g/dL — ABNORMAL LOW (ref 3.5–5.0)
Alkaline Phosphatase: 67 U/L (ref 38–126)
Anion gap: 7 (ref 5–15)
BUN: <5 mg/dL — ABNORMAL LOW (ref 6–20)
CO2: 26 mmol/L (ref 22–32)
Calcium: 8.9 mg/dL (ref 8.9–10.3)
Chloride: 108 mmol/L (ref 98–111)
Creatinine, Ser: 0.69 mg/dL (ref 0.44–1.00)
GFR calc Af Amer: >60 mL/min (ref >60)
GFR calc non Af Amer: >60 mL/min (ref >60)
Glucose, Bld: 96 mg/dL (ref 70–99)
Potassium: 4 mmol/L (ref 3.5–5.1)
Sodium: 141 mmol/L (ref 135–145)
Total Bilirubin: 0.7 mg/dL (ref 0.3–1.2)
Total Protein: 6.1 g/dL — ABNORMAL LOW (ref 6.5–8.1)

## 2019-05-31 MED ORDER — IBUPROFEN 800 MG PO TABS
800.0000 mg | ORAL_TABLET | Freq: Four times a day (QID) | ORAL | Status: DC
  Administered 2019-05-31 – 2019-06-01 (×3): 800 mg via ORAL
  Filled 2019-05-31 (×3): qty 1

## 2019-05-31 NOTE — Progress Notes (Signed)
1 Day Post-Op Procedure(s) (LRB): HYSTERECTOMY ABDOMINAL WITH SALPINGECTOMY (Bilateral)  Subjective: Patient reports incisional pain and tolerating PO.  No void yet since foley removed this am.  Ambulating well.  No N/V.  No flatus.  Objective: I have reviewed patient's vital signs, intake and output, medications and labs.  Vitals:   05/31/19 0202 05/31/19 0522  BP: 114/61 (!) 121/93  Pulse: (!) 58 (!) 56  Resp: 14 16  Temp: 98.4 F (36.9 C) 98 F (36.7 C)  SpO2: 98% 98%     General: alert, cooperative and appears stated age GI: incision: clean and dry and abd non-distended Extremities: extremities normal, atraumatic, no cyanosis or edema  Assessment: s/p Procedure(s): HYSTERECTOMY ABDOMINAL WITH SALPINGECTOMY (Bilateral): stable, progressing well and tolerating diet  Plan: Advance diet Encourage ambulation Advance to PO medication    LOS: 1 day    [name] 05/31/2019, 6:54 AM

## 2019-05-31 NOTE — Progress Notes (Signed)
Patient feeling somewhat better this afternoon.  Able to ambulate.  Passing flatus.  Tolerating full po.    Normal postop labs reviewed with patient.   Plan dispo home tomorrow AM  [name], DO

## 2019-06-01 LAB — SURGICAL PATHOLOGY

## 2019-06-01 MED ORDER — IBUPROFEN 800 MG PO TABS: 800.0000 mg | ORAL_TABLET | Freq: Four times a day (QID) | ORAL | 0 refills | Status: AC

## 2019-06-01 MED ORDER — OXYCODONE-ACETAMINOPHEN 5-325 MG PO TABS: 1.0000 | ORAL_TABLET | ORAL | 0 refills | Status: AC | PRN

## 2019-06-01 MED FILL — IBUPROFEN 800 MG TAB: 800 | 7 days supply | Qty: 30 | Fill #0

## 2019-06-01 MED FILL — OXYCODONE-APAP 5-325MG: 5-325 | 5 days supply | Qty: 30 | Fill #0

## 2019-06-01 NOTE — Discharge Instructions (Signed)
Call MD for T>100.4, heavy vaginal bleeding, severe abdominal pain, or respiratory distress.  Call office to schedule postop appointment in 2 weeks.  Pelvic rest x 6 weeks.  No driving while taking narcotics.

## 2019-06-01 NOTE — Discharge Summary (Signed)
Physician Discharge Summary  Patient ID: Anna Palmer MRN: [id number] DOB/AGE: July 16, 1985 34 y.o.  Admit date: 05/30/2019 Discharge date: 06/01/2019  Admission Diagnoses: Symptomatic uterine leimyomata  Discharge Diagnoses:  Active Problems:   Fibroids   S/P abdominal hysterectomy   Discharged Condition: good  Hospital Course: Patient was admitted for planned TAH, bilateral salpingectomy.  This procedure was completed without complication.  Patient did well postoperatively and on POD#2 was meeting all goals.  She was discharged home with office f/u in 2 weeks.   Consults: None  Significant Diagnostic Studies: n/a  Treatments: surgery: TAH, bilateral salpingectomy  Discharge Exam: Blood pressure 114/75, pulse 62, temperature 97.8 F (36.6 C), temperature source Oral, resp. rate 16, SpO2 100 %. General appearance: alert, cooperative and appears stated age Extremities: extremities normal, atraumatic, no cyanosis or edema Incision/Wound: c/d/i  Disposition: Discharge disposition: 01-Home or Self Care       Discharge Instructions    Discharge patient   Complete by: As directed    Discharge disposition: 01-Home or Self Care   Discharge patient date: 06/01/2019     Allergies as of 06/01/2019      Reactions   Feraheme [ferumoxytol] Other (See Comments)   Diaphotetic, passed out, short of breath, seizure like       Medication List    STOP taking these medications   ferrous sulfate 325 (65 FE) MG EC tablet     TAKE these medications   ALPRAZolam 0.5 MG tablet Commonly known as: XANAX Take 0.5 mg by mouth daily as needed for anxiety.   FLUoxetine 20 MG tablet Commonly known as: PROZAC Take 20 mg by mouth daily.   ibuprofen 800 MG tablet Commonly known as: ADVIL Take 1 tablet (800 mg total) by mouth 4 (four) times daily. What changed:   medication strength  how much to take  when to take this  reasons to take this   levothyroxine 88 MCG  tablet Commonly known as: SYNTHROID Take 88 mcg by mouth daily before breakfast.   omeprazole 40 MG capsule Commonly known as: PRILOSEC Take 40 mg by mouth daily.   ondansetron 4 MG tablet Commonly known as: ZOFRAN Take 4 mg by mouth every 8 (eight) hours as needed for nausea/vomiting.   oxyCODONE-acetaminophen 5-325 MG tablet Commonly known as: PERCOCET/ROXICET Take 1 tablet by mouth every 4 (four) hours as needed for moderate pain.   promethazine 25 MG tablet Commonly known as: PHENERGAN Take 25 mg by mouth every 6 (six) hours as needed for nausea or vomiting.        Signed: [name] 06/01/2019, 8:26 AM

## 2019-06-01 NOTE — Progress Notes (Signed)
2 Days Post-Op Procedure(s) (LRB): HYSTERECTOMY ABDOMINAL WITH SALPINGECTOMY (Bilateral)  Subjective: Patient reports tolerating PO, + flatus and no problems voiding.    Objective: I have reviewed patient's vital signs, intake and output, medications and labs.  Vitals:   05/31/19 2032 06/01/19 0442  BP: 130/85 114/75  Pulse: 78 62  Resp: 14 16  Temp: 98.3 F (36.8 C) 97.8 F (36.6 C)  SpO2: 100% 100%     General: alert, cooperative and appears stated age GI: incision: clean, dry and intact Extremities: extremities normal, atraumatic, no cyanosis or edema  Assessment: s/p Procedure(s): HYSTERECTOMY ABDOMINAL WITH SALPINGECTOMY (Bilateral): stable and progressing well  Plan: Discharge home  LOS: 2 days    [name] 06/01/2019, 8:21 AM

## 2019-06-02 ENCOUNTER — Other Ambulatory Visit: Payer: Self-pay | Admitting: *Deleted

## 2019-06-02 NOTE — Patient Outreach (Signed)
[place] [facility]) Care Management  06/02/2019  DEMONI LAWTON 09-21-1985 [id number]   Transition of care telephone call  Referral received:05/23/19 Initial outreach:06/02/19 Insurance: [employer]  Initial unsuccessful telephone call to patient's preferred number in order to complete transition of care assessment; no answer, left HIPAA compliant voicemail message requesting return call.   Objective: Per the electronic medical record, Mrs.Marcucci was hospitalized at [facility] from 1/18-1/20/21, for Total Abdominal Hysterectomy . Comorbidities : Uterine Fibroids   She  was discharged to home on 06/01/19  without the need for home health services or durable medical equipment per the discharge summary.   Plan: This RNCM will route unsuccessful outreach letter with [place] Management pamphlet and 24 hour Nurse Advice Line Magnet to [place] Management clinical pool to be mailed to patient's home address. This RNCM will attempt another outreach within 4 business days.   Joylene Draft, RN, BSN  [place] Management Coordinator  [phone]- Mobile [phone]- Toll Free Main Office

## 2019-06-07 ENCOUNTER — Other Ambulatory Visit: Payer: Self-pay | Admitting: *Deleted

## 2019-06-07 NOTE — Patient Outreach (Signed)
[place]) Care Management  06/07/2019  DI BAGOT [birth year] [id number]   Transition of care call Referral received: 05/23/19 Initial outreach attempt: 1/14 unsuccessful preop screening call, 1/21 initial post discharge call  Insurance: [employer] plan    2nd unsuccessful telephone call to patient's preferred contact number in order to complete post hospital discharge transition of care assessment , no answer left HIPAA compliant message requesting return call.    Objective: Per the electronic medical record, Mrs.Michalowski was hospitalized at [facility] from 1/18-1/20/21, for Total Abdominal Hysterectomy . Comorbidities : Uterine Fibroids   She  was discharged to home on 06/01/19  without the need for home health services or durable medical equipment per the discharge summary   Plan If no return call from patient will attempt 3rd outreach in the next 4 business days.    Joylene Draft, RN, BSN  [place] Management Coordinator  [phone]- Mobile [phone]- Toll Free Main Office

## 2019-06-10 ENCOUNTER — Other Ambulatory Visit: Payer: Self-pay | Admitting: *Deleted

## 2019-06-10 NOTE — Patient Outreach (Signed)
[facility] [facility]) Care Management  06/10/2019  MADEEHA VIRRUETA 1985/12/30 [id number]   Transition of care call Referral received: 05/23/19 Initial outreach attempt: 06/02/19 Insurance: [employer] Plan   Third unsuccessful telephone call to patient's preferred contact number in order to complete post hospital discharge transition of care assessment; no answer, left HIPAA compliant message requesting return call.   Objective: Per the electronic medical record,Mrs.Barmanwas hospitalized at [facility] 1/18-1/20/21, for Total Abdominal Hysterectomy .Comorbidities :Uterine Fibroids She was discharged to home on 1/20/21without the need for home health services or durable medical equipment per the discharge summary   Plan: If no return call from patient, will close case to [place] Management services in 10 business days after initial post hospital discharge outreach, on  06/02/19 .  Joylene Draft, RN, BSN  [place] Management Coordinator  [phone]- Mobile [phone]- Toll Free Main Office

## 2019-06-15 ENCOUNTER — Other Ambulatory Visit: Payer: Self-pay | Admitting: *Deleted

## 2019-06-15 NOTE — Patient Outreach (Signed)
[facility]) Care Management  06/15/2019  Anna Palmer January 17, 1986 [id number]   Transition of care /Case Closure Unsuccessful outreach    Referral received:05/23/19 Initial outreach:06/02/19 Insurance: [employer] plan    Unable to complete post hospital discharge transition of care assessment. No return call form patient after 3 call attempts and no response to request to contact RN Care Coordinator in unsuccessful outreach letter mailed to home on 06/02/19.  Objective: Per the electronic medical record, Anna Palmer was hospitalized at [facility] from 1/18-1/20/21, for Total Abdominal Hysterectomy . Comorbidities : Uterine Fibroids   She  was discharged to home on 06/01/19  without the need for home health services or durable medical equipment per the discharge summary.  Plan Case closed to [employer] as it has been 10 days since initial post discharge outreach attempt.   Joylene Draft, RN, BSN  [place] Management Coordinator  [phone]- Mobile [phone]- Toll Free Main Office

## 2019-07-11 MED FILL — FLUoxetine HCL 20 MG CAPS: 20 | 90 days supply | Qty: 90 | Fill #0

## 2019-07-11 MED FILL — LEVOTHYROXINE 88 MCG TABLET: 88 | 90 days supply | Qty: 90 | Fill #1

## 2019-11-01 ENCOUNTER — Other Ambulatory Visit (HOSPITAL_COMMUNITY): Payer: Self-pay | Admitting: Family Medicine

## 2019-11-01 MED FILL — ALPRAZolam 0.5 MG TABS: 0.5 | 30 days supply | Qty: 30 | Fill #0

## 2019-11-01 MED FILL — FLUOXETINE HCL 20 MG TABS: 20 | 90 days supply | Qty: 135 | Fill #0

## 2019-11-10 MED FILL — ALPRAZolam 0.5 MG TABS: 0.5 | 30 days supply | Qty: 30 | Fill #0

## 2019-11-10 MED FILL — FLUOXETINE HCL 20 MG TABS: 20 | 90 days supply | Qty: 135 | Fill #0

## 2020-01-18 MED FILL — LEVOTHYROXINE 88 MCG TABLET: 88 | 90 days supply | Qty: 90 | Fill #0

## 2020-03-20 ENCOUNTER — Other Ambulatory Visit (HOSPITAL_COMMUNITY): Payer: Self-pay | Admitting: Family Medicine

## 2020-03-20 MED FILL — FLUOXETINE HCL 20 MG TABS: 20 | 90 days supply | Qty: 135 | Fill #1

## 2020-04-17 MED FILL — LEVOTHYROXINE 88 MCG TABLET: 88 | 90 days supply | Qty: 90 | Fill #0

## 2020-04-18 ENCOUNTER — Other Ambulatory Visit (HOSPITAL_COMMUNITY): Payer: Self-pay | Admitting: Family Medicine

## 2020-06-26 ENCOUNTER — Other Ambulatory Visit (HOSPITAL_COMMUNITY): Payer: Self-pay | Admitting: Family Medicine

## 2020-06-26 MED FILL — FLUoxetine HCL 20 MG TABS: 20 | 90 days supply | Qty: 135 | Fill #0

## 2020-07-17 ENCOUNTER — Other Ambulatory Visit (HOSPITAL_COMMUNITY): Payer: Self-pay | Admitting: Family Medicine

## 2020-07-17 DIAGNOSIS — I10 Essential (primary) hypertension: Secondary | ICD-10-CM | POA: Diagnosis not present

## 2020-07-17 DIAGNOSIS — E039 Hypothyroidism, unspecified: Secondary | ICD-10-CM | POA: Diagnosis not present

## 2020-07-17 DIAGNOSIS — F419 Anxiety disorder, unspecified: Secondary | ICD-10-CM | POA: Diagnosis not present

## 2020-07-17 DIAGNOSIS — G4700 Insomnia, unspecified: Secondary | ICD-10-CM | POA: Diagnosis not present

## 2020-07-17 DIAGNOSIS — F325 Major depressive disorder, single episode, in full remission: Secondary | ICD-10-CM | POA: Diagnosis not present

## 2020-07-17 MED FILL — ALPRAZolam 0.5 MG TABS: 0.5 | 30 days supply | Qty: 30 | Fill #0

## 2020-07-17 MED FILL — ZOLPIDEM TARTRATE 10 MG TAB: 10 | 30 days supply | Qty: 30 | Fill #0

## 2020-09-26 ENCOUNTER — Other Ambulatory Visit (HOSPITAL_COMMUNITY): Payer: Self-pay

## 2020-09-27 ENCOUNTER — Other Ambulatory Visit (HOSPITAL_COMMUNITY): Payer: Self-pay

## 2020-09-27 MED ORDER — FLUOXETINE HCL 20 MG PO TABS
ORAL_TABLET | ORAL | 1 refills | Status: DC
  Filled 2020-09-27: qty 135, 90d supply, fill #0
  Filled 2020-12-27: qty 135, 90d supply, fill #1

## 2020-10-18 DIAGNOSIS — Z01419 Encounter for gynecological examination (general) (routine) without abnormal findings: Secondary | ICD-10-CM | POA: Diagnosis not present

## 2020-10-18 DIAGNOSIS — Z6829 Body mass index (BMI) 29.0-29.9, adult: Secondary | ICD-10-CM | POA: Diagnosis not present

## 2020-10-18 DIAGNOSIS — N760 Acute vaginitis: Secondary | ICD-10-CM | POA: Diagnosis not present

## 2020-10-23 ENCOUNTER — Other Ambulatory Visit (HOSPITAL_COMMUNITY): Payer: Self-pay

## 2020-10-23 MED FILL — Levothyroxine Sodium Tab 88 MCG: ORAL | 90 days supply | Qty: 90 | Fill #0 | Status: AC

## 2020-10-29 ENCOUNTER — Other Ambulatory Visit (HOSPITAL_COMMUNITY): Payer: Self-pay

## 2020-12-27 ENCOUNTER — Other Ambulatory Visit (HOSPITAL_COMMUNITY): Payer: Self-pay

## 2021-01-17 DIAGNOSIS — F325 Major depressive disorder, single episode, in full remission: Secondary | ICD-10-CM | POA: Diagnosis not present

## 2021-01-17 DIAGNOSIS — M25511 Pain in right shoulder: Secondary | ICD-10-CM | POA: Diagnosis not present

## 2021-01-17 DIAGNOSIS — E039 Hypothyroidism, unspecified: Secondary | ICD-10-CM | POA: Diagnosis not present

## 2021-01-17 DIAGNOSIS — F419 Anxiety disorder, unspecified: Secondary | ICD-10-CM | POA: Diagnosis not present

## 2021-01-17 DIAGNOSIS — G4700 Insomnia, unspecified: Secondary | ICD-10-CM | POA: Diagnosis not present

## 2021-01-18 ENCOUNTER — Other Ambulatory Visit (HOSPITAL_COMMUNITY): Payer: Self-pay

## 2021-01-18 MED ORDER — LEVOTHYROXINE SODIUM 88 MCG PO TABS
ORAL_TABLET | ORAL | 3 refills | Status: DC
  Filled 2021-01-18 – 2021-02-01 (×2): qty 90, 90d supply, fill #0
  Filled 2021-05-01: qty 90, 90d supply, fill #1
  Filled 2021-08-12: qty 90, 90d supply, fill #2
  Filled 2021-11-21: qty 90, 90d supply, fill #3

## 2021-01-28 ENCOUNTER — Other Ambulatory Visit (HOSPITAL_COMMUNITY): Payer: Self-pay

## 2021-02-01 ENCOUNTER — Other Ambulatory Visit (HOSPITAL_COMMUNITY): Payer: Self-pay

## 2021-02-14 ENCOUNTER — Other Ambulatory Visit (HOSPITAL_COMMUNITY): Payer: Self-pay

## 2021-02-14 DIAGNOSIS — M25511 Pain in right shoulder: Secondary | ICD-10-CM | POA: Diagnosis not present

## 2021-02-14 DIAGNOSIS — M5412 Radiculopathy, cervical region: Secondary | ICD-10-CM | POA: Diagnosis not present

## 2021-02-14 MED ORDER — PREDNISONE 10 MG (21) PO TBPK
ORAL_TABLET | ORAL | 0 refills | Status: AC
  Filled 2021-02-14 (×2): qty 21, 6d supply, fill #0

## 2021-03-03 DIAGNOSIS — H00015 Hordeolum externum left lower eyelid: Secondary | ICD-10-CM | POA: Diagnosis not present

## 2021-03-03 DIAGNOSIS — B3731 Acute candidiasis of vulva and vagina: Secondary | ICD-10-CM | POA: Diagnosis not present

## 2021-03-04 ENCOUNTER — Other Ambulatory Visit (HOSPITAL_COMMUNITY): Payer: Self-pay

## 2021-03-04 DIAGNOSIS — M79641 Pain in right hand: Secondary | ICD-10-CM | POA: Diagnosis not present

## 2021-03-04 DIAGNOSIS — M542 Cervicalgia: Secondary | ICD-10-CM | POA: Diagnosis not present

## 2021-03-04 DIAGNOSIS — R293 Abnormal posture: Secondary | ICD-10-CM | POA: Diagnosis not present

## 2021-03-04 DIAGNOSIS — M79621 Pain in right upper arm: Secondary | ICD-10-CM | POA: Diagnosis not present

## 2021-03-04 DIAGNOSIS — N760 Acute vaginitis: Secondary | ICD-10-CM | POA: Diagnosis not present

## 2021-03-04 MED ORDER — NYSTATIN-TRIAMCINOLONE 100000-0.1 UNIT/GM-% EX CREA
TOPICAL_CREAM | CUTANEOUS | 0 refills | Status: AC
  Filled 2021-03-04: qty 15, 7d supply, fill #0

## 2021-03-04 MED ORDER — FLUCONAZOLE 150 MG PO TABS
ORAL_TABLET | ORAL | 0 refills | Status: AC
  Filled 2021-03-04: qty 1, 1d supply, fill #0

## 2021-03-08 DIAGNOSIS — M79641 Pain in right hand: Secondary | ICD-10-CM | POA: Diagnosis not present

## 2021-03-08 DIAGNOSIS — M79621 Pain in right upper arm: Secondary | ICD-10-CM | POA: Diagnosis not present

## 2021-03-08 DIAGNOSIS — R293 Abnormal posture: Secondary | ICD-10-CM | POA: Diagnosis not present

## 2021-03-08 DIAGNOSIS — M542 Cervicalgia: Secondary | ICD-10-CM | POA: Diagnosis not present

## 2021-03-15 DIAGNOSIS — M79641 Pain in right hand: Secondary | ICD-10-CM | POA: Diagnosis not present

## 2021-03-15 DIAGNOSIS — M79621 Pain in right upper arm: Secondary | ICD-10-CM | POA: Diagnosis not present

## 2021-03-15 DIAGNOSIS — R293 Abnormal posture: Secondary | ICD-10-CM | POA: Diagnosis not present

## 2021-03-15 DIAGNOSIS — M542 Cervicalgia: Secondary | ICD-10-CM | POA: Diagnosis not present

## 2021-03-19 DIAGNOSIS — M79641 Pain in right hand: Secondary | ICD-10-CM | POA: Diagnosis not present

## 2021-03-19 DIAGNOSIS — M542 Cervicalgia: Secondary | ICD-10-CM | POA: Diagnosis not present

## 2021-03-19 DIAGNOSIS — R293 Abnormal posture: Secondary | ICD-10-CM | POA: Diagnosis not present

## 2021-03-19 DIAGNOSIS — M79621 Pain in right upper arm: Secondary | ICD-10-CM | POA: Diagnosis not present

## 2021-03-21 DIAGNOSIS — M79621 Pain in right upper arm: Secondary | ICD-10-CM | POA: Diagnosis not present

## 2021-03-21 DIAGNOSIS — M79641 Pain in right hand: Secondary | ICD-10-CM | POA: Diagnosis not present

## 2021-03-21 DIAGNOSIS — R293 Abnormal posture: Secondary | ICD-10-CM | POA: Diagnosis not present

## 2021-03-21 DIAGNOSIS — M542 Cervicalgia: Secondary | ICD-10-CM | POA: Diagnosis not present

## 2021-03-28 DIAGNOSIS — M79641 Pain in right hand: Secondary | ICD-10-CM | POA: Diagnosis not present

## 2021-03-28 DIAGNOSIS — M542 Cervicalgia: Secondary | ICD-10-CM | POA: Diagnosis not present

## 2021-03-28 DIAGNOSIS — R293 Abnormal posture: Secondary | ICD-10-CM | POA: Diagnosis not present

## 2021-03-28 DIAGNOSIS — M79621 Pain in right upper arm: Secondary | ICD-10-CM | POA: Diagnosis not present

## 2021-04-02 DIAGNOSIS — M79641 Pain in right hand: Secondary | ICD-10-CM | POA: Diagnosis not present

## 2021-04-02 DIAGNOSIS — M542 Cervicalgia: Secondary | ICD-10-CM | POA: Diagnosis not present

## 2021-04-02 DIAGNOSIS — M79621 Pain in right upper arm: Secondary | ICD-10-CM | POA: Diagnosis not present

## 2021-04-02 DIAGNOSIS — R293 Abnormal posture: Secondary | ICD-10-CM | POA: Diagnosis not present

## 2021-04-03 ENCOUNTER — Other Ambulatory Visit (HOSPITAL_COMMUNITY): Payer: Self-pay

## 2021-04-03 MED ORDER — FLUOXETINE HCL 20 MG PO TABS
ORAL_TABLET | ORAL | 0 refills | Status: DC
  Filled 2021-04-03: qty 135, 90d supply, fill #0

## 2021-04-09 DIAGNOSIS — M542 Cervicalgia: Secondary | ICD-10-CM | POA: Diagnosis not present

## 2021-04-09 DIAGNOSIS — M79621 Pain in right upper arm: Secondary | ICD-10-CM | POA: Diagnosis not present

## 2021-04-09 DIAGNOSIS — M79641 Pain in right hand: Secondary | ICD-10-CM | POA: Diagnosis not present

## 2021-04-09 DIAGNOSIS — R293 Abnormal posture: Secondary | ICD-10-CM | POA: Diagnosis not present

## 2021-04-11 DIAGNOSIS — M79641 Pain in right hand: Secondary | ICD-10-CM | POA: Diagnosis not present

## 2021-04-11 DIAGNOSIS — M79621 Pain in right upper arm: Secondary | ICD-10-CM | POA: Diagnosis not present

## 2021-04-11 DIAGNOSIS — M542 Cervicalgia: Secondary | ICD-10-CM | POA: Diagnosis not present

## 2021-04-11 DIAGNOSIS — R293 Abnormal posture: Secondary | ICD-10-CM | POA: Diagnosis not present

## 2021-04-16 DIAGNOSIS — M79621 Pain in right upper arm: Secondary | ICD-10-CM | POA: Diagnosis not present

## 2021-04-16 DIAGNOSIS — R293 Abnormal posture: Secondary | ICD-10-CM | POA: Diagnosis not present

## 2021-04-16 DIAGNOSIS — M79641 Pain in right hand: Secondary | ICD-10-CM | POA: Diagnosis not present

## 2021-04-16 DIAGNOSIS — M542 Cervicalgia: Secondary | ICD-10-CM | POA: Diagnosis not present

## 2021-05-01 ENCOUNTER — Other Ambulatory Visit (HOSPITAL_COMMUNITY): Payer: Self-pay

## 2021-05-21 DIAGNOSIS — M79641 Pain in right hand: Secondary | ICD-10-CM | POA: Diagnosis not present

## 2021-05-21 DIAGNOSIS — R293 Abnormal posture: Secondary | ICD-10-CM | POA: Diagnosis not present

## 2021-05-21 DIAGNOSIS — M542 Cervicalgia: Secondary | ICD-10-CM | POA: Diagnosis not present

## 2021-05-21 DIAGNOSIS — M79621 Pain in right upper arm: Secondary | ICD-10-CM | POA: Diagnosis not present

## 2021-06-06 DIAGNOSIS — M79641 Pain in right hand: Secondary | ICD-10-CM | POA: Diagnosis not present

## 2021-06-06 DIAGNOSIS — R293 Abnormal posture: Secondary | ICD-10-CM | POA: Diagnosis not present

## 2021-06-06 DIAGNOSIS — M79621 Pain in right upper arm: Secondary | ICD-10-CM | POA: Diagnosis not present

## 2021-06-06 DIAGNOSIS — M542 Cervicalgia: Secondary | ICD-10-CM | POA: Diagnosis not present

## 2021-06-11 DIAGNOSIS — M542 Cervicalgia: Secondary | ICD-10-CM | POA: Diagnosis not present

## 2021-06-11 DIAGNOSIS — R293 Abnormal posture: Secondary | ICD-10-CM | POA: Diagnosis not present

## 2021-06-11 DIAGNOSIS — M79621 Pain in right upper arm: Secondary | ICD-10-CM | POA: Diagnosis not present

## 2021-06-11 DIAGNOSIS — M79641 Pain in right hand: Secondary | ICD-10-CM | POA: Diagnosis not present

## 2021-06-26 DIAGNOSIS — M79641 Pain in right hand: Secondary | ICD-10-CM | POA: Diagnosis not present

## 2021-06-26 DIAGNOSIS — M79621 Pain in right upper arm: Secondary | ICD-10-CM | POA: Diagnosis not present

## 2021-06-26 DIAGNOSIS — M542 Cervicalgia: Secondary | ICD-10-CM | POA: Diagnosis not present

## 2021-06-26 DIAGNOSIS — R293 Abnormal posture: Secondary | ICD-10-CM | POA: Diagnosis not present

## 2021-07-04 DIAGNOSIS — M79641 Pain in right hand: Secondary | ICD-10-CM | POA: Diagnosis not present

## 2021-07-04 DIAGNOSIS — M542 Cervicalgia: Secondary | ICD-10-CM | POA: Diagnosis not present

## 2021-07-04 DIAGNOSIS — M79621 Pain in right upper arm: Secondary | ICD-10-CM | POA: Diagnosis not present

## 2021-07-04 DIAGNOSIS — R293 Abnormal posture: Secondary | ICD-10-CM | POA: Diagnosis not present

## 2021-07-08 ENCOUNTER — Other Ambulatory Visit (HOSPITAL_COMMUNITY): Payer: Self-pay

## 2021-07-09 ENCOUNTER — Other Ambulatory Visit (HOSPITAL_COMMUNITY): Payer: Self-pay

## 2021-07-09 DIAGNOSIS — M79641 Pain in right hand: Secondary | ICD-10-CM | POA: Diagnosis not present

## 2021-07-09 DIAGNOSIS — M79621 Pain in right upper arm: Secondary | ICD-10-CM | POA: Diagnosis not present

## 2021-07-09 DIAGNOSIS — M542 Cervicalgia: Secondary | ICD-10-CM | POA: Diagnosis not present

## 2021-07-09 DIAGNOSIS — R293 Abnormal posture: Secondary | ICD-10-CM | POA: Diagnosis not present

## 2021-07-09 MED ORDER — FLUOXETINE HCL 20 MG PO TABS
ORAL_TABLET | ORAL | 0 refills | Status: DC
  Filled 2021-07-09: qty 135, 90d supply, fill #0

## 2021-07-18 DIAGNOSIS — R293 Abnormal posture: Secondary | ICD-10-CM | POA: Diagnosis not present

## 2021-07-18 DIAGNOSIS — M542 Cervicalgia: Secondary | ICD-10-CM | POA: Diagnosis not present

## 2021-07-18 DIAGNOSIS — M79641 Pain in right hand: Secondary | ICD-10-CM | POA: Diagnosis not present

## 2021-07-18 DIAGNOSIS — M79621 Pain in right upper arm: Secondary | ICD-10-CM | POA: Diagnosis not present

## 2021-07-23 ENCOUNTER — Other Ambulatory Visit (HOSPITAL_COMMUNITY): Payer: Self-pay

## 2021-07-23 DIAGNOSIS — E039 Hypothyroidism, unspecified: Secondary | ICD-10-CM | POA: Diagnosis not present

## 2021-07-23 DIAGNOSIS — R293 Abnormal posture: Secondary | ICD-10-CM | POA: Diagnosis not present

## 2021-07-23 DIAGNOSIS — F419 Anxiety disorder, unspecified: Secondary | ICD-10-CM | POA: Diagnosis not present

## 2021-07-23 DIAGNOSIS — M542 Cervicalgia: Secondary | ICD-10-CM | POA: Diagnosis not present

## 2021-07-23 DIAGNOSIS — M79621 Pain in right upper arm: Secondary | ICD-10-CM | POA: Diagnosis not present

## 2021-07-23 DIAGNOSIS — Z79899 Other long term (current) drug therapy: Secondary | ICD-10-CM | POA: Diagnosis not present

## 2021-07-23 DIAGNOSIS — I10 Essential (primary) hypertension: Secondary | ICD-10-CM | POA: Diagnosis not present

## 2021-07-23 DIAGNOSIS — F325 Major depressive disorder, single episode, in full remission: Secondary | ICD-10-CM | POA: Diagnosis not present

## 2021-07-23 DIAGNOSIS — G4700 Insomnia, unspecified: Secondary | ICD-10-CM | POA: Diagnosis not present

## 2021-07-23 DIAGNOSIS — M79641 Pain in right hand: Secondary | ICD-10-CM | POA: Diagnosis not present

## 2021-07-23 DIAGNOSIS — J0100 Acute maxillary sinusitis, unspecified: Secondary | ICD-10-CM | POA: Diagnosis not present

## 2021-07-23 MED ORDER — AMOXICILLIN 875 MG PO TABS
ORAL_TABLET | ORAL | 0 refills | Status: AC
  Filled 2021-07-23: qty 10, 5d supply, fill #0

## 2021-07-23 MED ORDER — ZOLPIDEM TARTRATE 10 MG PO TABS
ORAL_TABLET | ORAL | 5 refills | Status: AC
  Filled 2021-07-23: qty 30, 30d supply, fill #0

## 2021-07-23 MED ORDER — ALPRAZOLAM 0.5 MG PO TABS
ORAL_TABLET | ORAL | 2 refills | Status: AC
  Filled 2021-07-23 (×2): qty 30, 30d supply, fill #0

## 2021-08-01 DIAGNOSIS — R293 Abnormal posture: Secondary | ICD-10-CM | POA: Diagnosis not present

## 2021-08-01 DIAGNOSIS — M542 Cervicalgia: Secondary | ICD-10-CM | POA: Diagnosis not present

## 2021-08-01 DIAGNOSIS — M79641 Pain in right hand: Secondary | ICD-10-CM | POA: Diagnosis not present

## 2021-08-01 DIAGNOSIS — M79621 Pain in right upper arm: Secondary | ICD-10-CM | POA: Diagnosis not present

## 2021-08-06 DIAGNOSIS — M79621 Pain in right upper arm: Secondary | ICD-10-CM | POA: Diagnosis not present

## 2021-08-06 DIAGNOSIS — M79641 Pain in right hand: Secondary | ICD-10-CM | POA: Diagnosis not present

## 2021-08-06 DIAGNOSIS — R293 Abnormal posture: Secondary | ICD-10-CM | POA: Diagnosis not present

## 2021-08-06 DIAGNOSIS — M542 Cervicalgia: Secondary | ICD-10-CM | POA: Diagnosis not present

## 2021-08-13 ENCOUNTER — Other Ambulatory Visit (HOSPITAL_COMMUNITY): Payer: Self-pay

## 2021-08-20 DIAGNOSIS — M79621 Pain in right upper arm: Secondary | ICD-10-CM | POA: Diagnosis not present

## 2021-08-20 DIAGNOSIS — M542 Cervicalgia: Secondary | ICD-10-CM | POA: Diagnosis not present

## 2021-08-20 DIAGNOSIS — M79641 Pain in right hand: Secondary | ICD-10-CM | POA: Diagnosis not present

## 2021-08-20 DIAGNOSIS — R293 Abnormal posture: Secondary | ICD-10-CM | POA: Diagnosis not present

## 2021-08-29 DIAGNOSIS — M542 Cervicalgia: Secondary | ICD-10-CM | POA: Diagnosis not present

## 2021-08-29 DIAGNOSIS — M79641 Pain in right hand: Secondary | ICD-10-CM | POA: Diagnosis not present

## 2021-08-29 DIAGNOSIS — R293 Abnormal posture: Secondary | ICD-10-CM | POA: Diagnosis not present

## 2021-08-29 DIAGNOSIS — M79621 Pain in right upper arm: Secondary | ICD-10-CM | POA: Diagnosis not present

## 2021-09-02 DIAGNOSIS — M79641 Pain in right hand: Secondary | ICD-10-CM | POA: Diagnosis not present

## 2021-09-02 DIAGNOSIS — M542 Cervicalgia: Secondary | ICD-10-CM | POA: Diagnosis not present

## 2021-09-02 DIAGNOSIS — R293 Abnormal posture: Secondary | ICD-10-CM | POA: Diagnosis not present

## 2021-09-02 DIAGNOSIS — M79621 Pain in right upper arm: Secondary | ICD-10-CM | POA: Diagnosis not present

## 2021-09-12 DIAGNOSIS — M542 Cervicalgia: Secondary | ICD-10-CM | POA: Diagnosis not present

## 2021-09-12 DIAGNOSIS — M79641 Pain in right hand: Secondary | ICD-10-CM | POA: Diagnosis not present

## 2021-09-12 DIAGNOSIS — M79621 Pain in right upper arm: Secondary | ICD-10-CM | POA: Diagnosis not present

## 2021-09-12 DIAGNOSIS — R293 Abnormal posture: Secondary | ICD-10-CM | POA: Diagnosis not present

## 2021-09-17 DIAGNOSIS — R293 Abnormal posture: Secondary | ICD-10-CM | POA: Diagnosis not present

## 2021-09-17 DIAGNOSIS — M79641 Pain in right hand: Secondary | ICD-10-CM | POA: Diagnosis not present

## 2021-09-17 DIAGNOSIS — M79621 Pain in right upper arm: Secondary | ICD-10-CM | POA: Diagnosis not present

## 2021-09-17 DIAGNOSIS — M542 Cervicalgia: Secondary | ICD-10-CM | POA: Diagnosis not present

## 2021-09-27 ENCOUNTER — Other Ambulatory Visit (HOSPITAL_COMMUNITY): Payer: Self-pay

## 2021-09-27 DIAGNOSIS — H109 Unspecified conjunctivitis: Secondary | ICD-10-CM | POA: Diagnosis not present

## 2021-09-27 MED ORDER — POLYMYXIN B-TRIMETHOPRIM 10000-0.1 UNIT/ML-% OP SOLN
OPHTHALMIC | 0 refills | Status: AC
  Filled 2021-09-27: qty 10, 5d supply, fill #0

## 2021-09-29 DIAGNOSIS — H1031 Unspecified acute conjunctivitis, right eye: Secondary | ICD-10-CM | POA: Diagnosis not present

## 2021-09-29 DIAGNOSIS — J0140 Acute pansinusitis, unspecified: Secondary | ICD-10-CM | POA: Diagnosis not present

## 2021-10-16 ENCOUNTER — Other Ambulatory Visit (HOSPITAL_COMMUNITY): Payer: Self-pay

## 2021-10-16 MED ORDER — FLUOXETINE HCL 20 MG PO TABS
ORAL_TABLET | ORAL | 1 refills | Status: AC
  Filled 2021-10-16: qty 135, 90d supply, fill #0
  Filled 2022-01-25: qty 135, 90d supply, fill #1

## 2021-10-17 ENCOUNTER — Other Ambulatory Visit (HOSPITAL_COMMUNITY): Payer: Self-pay

## 2021-11-21 ENCOUNTER — Other Ambulatory Visit (HOSPITAL_COMMUNITY): Payer: Self-pay

## 2021-11-23 ENCOUNTER — Other Ambulatory Visit (HOSPITAL_COMMUNITY): Payer: Self-pay

## 2021-11-25 ENCOUNTER — Other Ambulatory Visit (HOSPITAL_COMMUNITY): Payer: Self-pay

## 2022-01-05 DIAGNOSIS — R1032 Left lower quadrant pain: Secondary | ICD-10-CM | POA: Diagnosis not present

## 2022-01-06 DIAGNOSIS — R1032 Left lower quadrant pain: Secondary | ICD-10-CM | POA: Diagnosis not present

## 2022-01-07 ENCOUNTER — Ambulatory Visit
Admission: RE | Admit: 2022-01-07 | Discharge: 2022-01-07 | Disposition: A | Discharge status: Home / Self Care | Payer: 59 | Source: Ambulatory Visit | Attending: Family Medicine | Admitting: Family Medicine

## 2022-01-07 ENCOUNTER — Other Ambulatory Visit: Payer: Self-pay | Admitting: Family Medicine

## 2022-01-07 DIAGNOSIS — R1032 Left lower quadrant pain: Secondary | ICD-10-CM

## 2022-01-07 DIAGNOSIS — N838 Other noninflammatory disorders of ovary, fallopian tube and broad ligament: Secondary | ICD-10-CM | POA: Diagnosis not present

## 2022-01-10 ENCOUNTER — Other Ambulatory Visit: Payer: Self-pay | Admitting: Family Medicine

## 2022-01-10 DIAGNOSIS — N83201 Unspecified ovarian cyst, right side: Secondary | ICD-10-CM

## 2022-01-14 ENCOUNTER — Other Ambulatory Visit: Payer: Self-pay | Admitting: Family Medicine

## 2022-01-14 DIAGNOSIS — R1032 Left lower quadrant pain: Secondary | ICD-10-CM

## 2022-01-16 ENCOUNTER — Ambulatory Visit
Admission: RE | Admit: 2022-01-16 | Discharge: 2022-01-16 | Disposition: A | Discharge status: Home / Self Care | Payer: 59 | Source: Ambulatory Visit | Attending: Family Medicine | Admitting: Family Medicine

## 2022-01-16 DIAGNOSIS — R1032 Left lower quadrant pain: Secondary | ICD-10-CM

## 2022-01-16 DIAGNOSIS — K5730 Diverticulosis of large intestine without perforation or abscess without bleeding: Secondary | ICD-10-CM | POA: Diagnosis not present

## 2022-01-16 MED ORDER — IOPAMIDOL (ISOVUE-300) INJECTION 61%
100.0000 mL | Freq: Once | INTRAVENOUS | Status: AC | PRN
  Administered 2022-01-16: 100 mL via INTRAVENOUS

## 2022-01-26 ENCOUNTER — Other Ambulatory Visit (HOSPITAL_COMMUNITY): Payer: Self-pay

## 2022-01-27 ENCOUNTER — Other Ambulatory Visit (HOSPITAL_COMMUNITY): Payer: Self-pay

## 2022-01-30 DIAGNOSIS — F325 Major depressive disorder, single episode, in full remission: Secondary | ICD-10-CM | POA: Diagnosis not present

## 2022-01-30 DIAGNOSIS — E039 Hypothyroidism, unspecified: Secondary | ICD-10-CM | POA: Diagnosis not present

## 2022-01-30 DIAGNOSIS — R809 Proteinuria, unspecified: Secondary | ICD-10-CM | POA: Diagnosis not present

## 2022-01-30 DIAGNOSIS — R1030 Lower abdominal pain, unspecified: Secondary | ICD-10-CM | POA: Diagnosis not present

## 2022-01-30 DIAGNOSIS — I10 Essential (primary) hypertension: Secondary | ICD-10-CM | POA: Diagnosis not present

## 2022-02-27 ENCOUNTER — Other Ambulatory Visit (HOSPITAL_COMMUNITY): Payer: Self-pay

## 2022-03-01 ENCOUNTER — Other Ambulatory Visit (HOSPITAL_COMMUNITY): Payer: Self-pay

## 2022-03-01 MED ORDER — LEVOTHYROXINE SODIUM 88 MCG PO TABS
ORAL_TABLET | ORAL | 0 refills | Status: DC
  Filled 2022-03-01: qty 90, 90d supply, fill #0

## 2022-03-03 ENCOUNTER — Other Ambulatory Visit (HOSPITAL_COMMUNITY): Payer: Self-pay

## 2022-03-04 ENCOUNTER — Other Ambulatory Visit (HOSPITAL_COMMUNITY): Payer: Self-pay

## 2022-05-02 ENCOUNTER — Other Ambulatory Visit (HOSPITAL_COMMUNITY): Payer: Self-pay

## 2022-05-07 ENCOUNTER — Other Ambulatory Visit (HOSPITAL_COMMUNITY): Payer: Self-pay

## 2022-05-08 ENCOUNTER — Other Ambulatory Visit (HOSPITAL_COMMUNITY): Payer: Self-pay

## 2022-05-08 MED ORDER — FLUOXETINE HCL 20 MG PO TABS
30.0000 mg | ORAL_TABLET | Freq: Every day | ORAL | 0 refills | Status: DC
  Filled 2022-05-08: qty 135, 90d supply, fill #0

## 2022-05-11 ENCOUNTER — Other Ambulatory Visit (HOSPITAL_COMMUNITY): Payer: Self-pay

## 2022-05-29 ENCOUNTER — Other Ambulatory Visit (HOSPITAL_COMMUNITY): Payer: Self-pay

## 2022-06-02 ENCOUNTER — Other Ambulatory Visit (HOSPITAL_COMMUNITY): Payer: Self-pay

## 2022-06-02 MED ORDER — LEVOTHYROXINE SODIUM 88 MCG PO TABS
ORAL_TABLET | ORAL | 0 refills | Status: DC
  Filled 2022-06-02 – 2022-06-11 (×2): qty 90, 90d supply, fill #0

## 2022-06-11 ENCOUNTER — Other Ambulatory Visit (HOSPITAL_COMMUNITY): Payer: Self-pay

## 2022-07-31 ENCOUNTER — Other Ambulatory Visit (HOSPITAL_COMMUNITY): Payer: Self-pay

## 2022-08-04 ENCOUNTER — Other Ambulatory Visit (HOSPITAL_COMMUNITY): Payer: Self-pay

## 2022-08-04 MED ORDER — FLUOXETINE HCL 20 MG PO TABS
30.0000 mg | ORAL_TABLET | Freq: Every day | ORAL | 0 refills | Status: DC
  Filled 2022-08-04: qty 135, 90d supply, fill #0

## 2022-09-15 ENCOUNTER — Other Ambulatory Visit (HOSPITAL_COMMUNITY): Payer: Self-pay

## 2022-09-16 ENCOUNTER — Other Ambulatory Visit (HOSPITAL_COMMUNITY): Payer: Self-pay

## 2022-09-16 MED ORDER — LEVOTHYROXINE SODIUM 88 MCG PO TABS
88.0000 ug | ORAL_TABLET | Freq: Every morning | ORAL | 0 refills | Status: DC
  Filled 2022-09-16: qty 90, 90d supply, fill #0

## 2022-11-05 ENCOUNTER — Other Ambulatory Visit (HOSPITAL_COMMUNITY): Payer: Self-pay

## 2022-11-05 DIAGNOSIS — F419 Anxiety disorder, unspecified: Secondary | ICD-10-CM | POA: Diagnosis not present

## 2022-11-05 DIAGNOSIS — E039 Hypothyroidism, unspecified: Secondary | ICD-10-CM | POA: Diagnosis not present

## 2022-11-05 DIAGNOSIS — Z79899 Other long term (current) drug therapy: Secondary | ICD-10-CM | POA: Diagnosis not present

## 2022-11-05 MED ORDER — ALPRAZOLAM 0.5 MG PO TABS
0.5000 mg | ORAL_TABLET | Freq: Every day | ORAL | 3 refills | Status: AC
  Filled 2022-11-05 – 2023-01-07 (×3): qty 30, 30d supply, fill #0

## 2022-11-05 MED ORDER — FLUOXETINE HCL 20 MG PO TABS
30.0000 mg | ORAL_TABLET | Freq: Every day | ORAL | 3 refills | Status: DC
  Filled 2022-11-05 – 2022-11-21 (×2): qty 135, 90d supply, fill #0
  Filled 2023-03-09: qty 135, 90d supply, fill #1
  Filled 2023-06-23: qty 135, 90d supply, fill #2
  Filled 2023-09-30: qty 135, 90d supply, fill #3

## 2022-11-05 MED ORDER — LEVOTHYROXINE SODIUM 88 MCG PO TABS
88.0000 ug | ORAL_TABLET | Freq: Every morning | ORAL | 3 refills | Status: DC
  Filled 2022-11-05 – 2023-01-06 (×2): qty 90, 90d supply, fill #0
  Filled 2023-04-21: qty 90, 90d supply, fill #1
  Filled 2023-07-28: qty 90, 90d supply, fill #2
  Filled 2023-11-05: qty 90, 90d supply, fill #3

## 2022-11-14 ENCOUNTER — Other Ambulatory Visit (HOSPITAL_COMMUNITY): Payer: Self-pay

## 2022-11-21 ENCOUNTER — Other Ambulatory Visit (HOSPITAL_COMMUNITY): Payer: Self-pay

## 2022-11-22 ENCOUNTER — Other Ambulatory Visit (HOSPITAL_COMMUNITY): Payer: Self-pay

## 2022-11-25 ENCOUNTER — Other Ambulatory Visit: Payer: Self-pay

## 2022-12-01 ENCOUNTER — Other Ambulatory Visit (HOSPITAL_COMMUNITY): Payer: Self-pay

## 2023-01-06 ENCOUNTER — Other Ambulatory Visit (HOSPITAL_COMMUNITY): Payer: Self-pay

## 2023-01-07 ENCOUNTER — Other Ambulatory Visit (HOSPITAL_COMMUNITY): Payer: Self-pay

## 2023-01-08 ENCOUNTER — Other Ambulatory Visit (HOSPITAL_COMMUNITY): Payer: Self-pay

## 2023-03-12 ENCOUNTER — Other Ambulatory Visit: Payer: Self-pay

## 2023-03-12 ENCOUNTER — Other Ambulatory Visit (HOSPITAL_COMMUNITY): Payer: Self-pay

## 2023-03-12 DIAGNOSIS — R030 Elevated blood-pressure reading, without diagnosis of hypertension: Secondary | ICD-10-CM | POA: Diagnosis not present

## 2023-03-12 DIAGNOSIS — R1115 Cyclical vomiting syndrome unrelated to migraine: Secondary | ICD-10-CM | POA: Diagnosis not present

## 2023-03-12 MED ORDER — SUMATRIPTAN SUCCINATE 25 MG PO TABS
ORAL_TABLET | ORAL | 1 refills | Status: AC
  Filled 2023-03-12: qty 9, 30d supply, fill #0

## 2023-03-12 MED ORDER — PROMETHAZINE HCL 25 MG PO TABS
ORAL_TABLET | ORAL | 1 refills | Status: AC
  Filled 2023-03-12: qty 30, 10d supply, fill #0

## 2023-03-12 MED ORDER — ONDANSETRON HCL 8 MG PO TABS
8.0000 mg | ORAL_TABLET | Freq: Four times a day (QID) | ORAL | 1 refills | Status: AC | PRN
  Filled 2023-03-12: qty 30, 8d supply, fill #0

## 2023-03-31 ENCOUNTER — Other Ambulatory Visit (HOSPITAL_COMMUNITY): Payer: Self-pay

## 2023-04-22 ENCOUNTER — Other Ambulatory Visit (HOSPITAL_COMMUNITY): Payer: Self-pay

## 2023-06-02 DIAGNOSIS — F419 Anxiety disorder, unspecified: Secondary | ICD-10-CM | POA: Diagnosis not present

## 2023-06-02 DIAGNOSIS — G43A0 Cyclical vomiting, not intractable: Secondary | ICD-10-CM | POA: Diagnosis not present

## 2023-06-02 DIAGNOSIS — F329 Major depressive disorder, single episode, unspecified: Secondary | ICD-10-CM | POA: Diagnosis not present

## 2023-06-02 DIAGNOSIS — E039 Hypothyroidism, unspecified: Secondary | ICD-10-CM | POA: Diagnosis not present

## 2023-06-02 DIAGNOSIS — I10 Essential (primary) hypertension: Secondary | ICD-10-CM | POA: Diagnosis not present

## 2023-06-04 ENCOUNTER — Other Ambulatory Visit (HOSPITAL_COMMUNITY): Payer: Self-pay

## 2023-06-09 ENCOUNTER — Other Ambulatory Visit (HOSPITAL_COMMUNITY): Payer: Self-pay

## 2023-06-09 MED ORDER — LISINOPRIL 10 MG PO TABS
10.0000 mg | ORAL_TABLET | Freq: Every day | ORAL | 1 refills | Status: AC
  Filled 2023-06-09: qty 90, 90d supply, fill #0

## 2023-06-19 ENCOUNTER — Other Ambulatory Visit (HOSPITAL_COMMUNITY): Payer: Self-pay

## 2023-06-19 DIAGNOSIS — R1013 Epigastric pain: Secondary | ICD-10-CM | POA: Diagnosis not present

## 2023-06-19 DIAGNOSIS — R110 Nausea: Secondary | ICD-10-CM | POA: Diagnosis not present

## 2023-06-19 DIAGNOSIS — K30 Functional dyspepsia: Secondary | ICD-10-CM | POA: Diagnosis not present

## 2023-06-19 MED ORDER — AMITRIPTYLINE HCL 10 MG PO TABS
10.0000 mg | ORAL_TABLET | Freq: Every day | ORAL | 0 refills | Status: AC
  Filled 2023-06-19: qty 30, 30d supply, fill #0

## 2023-07-20 ENCOUNTER — Other Ambulatory Visit (HOSPITAL_COMMUNITY): Payer: Self-pay

## 2023-07-20 DIAGNOSIS — F329 Major depressive disorder, single episode, unspecified: Secondary | ICD-10-CM | POA: Diagnosis not present

## 2023-07-20 DIAGNOSIS — Z13220 Encounter for screening for lipoid disorders: Secondary | ICD-10-CM | POA: Diagnosis not present

## 2023-07-20 DIAGNOSIS — Z1159 Encounter for screening for other viral diseases: Secondary | ICD-10-CM | POA: Diagnosis not present

## 2023-07-20 DIAGNOSIS — R1013 Epigastric pain: Secondary | ICD-10-CM | POA: Diagnosis not present

## 2023-07-20 DIAGNOSIS — I10 Essential (primary) hypertension: Secondary | ICD-10-CM | POA: Diagnosis not present

## 2023-07-20 DIAGNOSIS — E039 Hypothyroidism, unspecified: Secondary | ICD-10-CM | POA: Diagnosis not present

## 2023-07-20 DIAGNOSIS — G4700 Insomnia, unspecified: Secondary | ICD-10-CM | POA: Diagnosis not present

## 2023-07-20 DIAGNOSIS — Z0000 Encounter for general adult medical examination without abnormal findings: Secondary | ICD-10-CM | POA: Diagnosis not present

## 2023-07-20 MED ORDER — LISINOPRIL 20 MG PO TABS
20.0000 mg | ORAL_TABLET | Freq: Every day | ORAL | 1 refills | Status: DC
  Filled 2023-07-20: qty 90, 90d supply, fill #0
  Filled 2023-11-23: qty 90, 90d supply, fill #1

## 2023-07-20 MED ORDER — ALPRAZOLAM 0.5 MG PO TABS
0.5000 mg | ORAL_TABLET | Freq: Every day | ORAL | 1 refills | Status: AC
  Filled 2023-07-20: qty 30, 30d supply, fill #0

## 2023-07-28 ENCOUNTER — Other Ambulatory Visit (HOSPITAL_COMMUNITY): Payer: Self-pay

## 2023-07-31 ENCOUNTER — Other Ambulatory Visit (HOSPITAL_COMMUNITY): Payer: Self-pay

## 2023-08-12 DIAGNOSIS — D72829 Elevated white blood cell count, unspecified: Secondary | ICD-10-CM | POA: Diagnosis not present

## 2023-09-29 ENCOUNTER — Other Ambulatory Visit (HOSPITAL_COMMUNITY): Payer: Self-pay

## 2023-09-29 ENCOUNTER — Other Ambulatory Visit (HOSPITAL_COMMUNITY): Payer: Self-pay | Admitting: Gastroenterology

## 2023-09-29 DIAGNOSIS — Z791 Long term (current) use of non-steroidal anti-inflammatories (NSAID): Secondary | ICD-10-CM | POA: Diagnosis not present

## 2023-09-29 DIAGNOSIS — K219 Gastro-esophageal reflux disease without esophagitis: Secondary | ICD-10-CM | POA: Diagnosis not present

## 2023-09-29 DIAGNOSIS — R112 Nausea with vomiting, unspecified: Secondary | ICD-10-CM

## 2023-09-29 MED ORDER — PANTOPRAZOLE SODIUM 40 MG PO TBEC
40.0000 mg | DELAYED_RELEASE_TABLET | Freq: Every morning | ORAL | 2 refills | Status: AC
  Filled 2023-09-29: qty 30, 30d supply, fill #0

## 2023-10-23 ENCOUNTER — Encounter (HOSPITAL_COMMUNITY)

## 2023-10-23 ENCOUNTER — Encounter (HOSPITAL_COMMUNITY): Payer: Self-pay

## 2023-11-23 ENCOUNTER — Other Ambulatory Visit (HOSPITAL_COMMUNITY): Payer: Self-pay

## 2024-01-01 ENCOUNTER — Other Ambulatory Visit (HOSPITAL_COMMUNITY): Payer: Self-pay

## 2024-01-02 ENCOUNTER — Other Ambulatory Visit (HOSPITAL_COMMUNITY): Payer: Self-pay

## 2024-01-02 MED ORDER — FLUOXETINE HCL 20 MG PO TABS
25.0000 mg | ORAL_TABLET | Freq: Every day | ORAL | 0 refills | Status: DC
  Filled 2024-01-02: qty 135, 90d supply, fill #0

## 2024-01-07 DIAGNOSIS — G43A0 Cyclical vomiting, not intractable: Secondary | ICD-10-CM | POA: Diagnosis not present

## 2024-01-07 DIAGNOSIS — R1013 Epigastric pain: Secondary | ICD-10-CM | POA: Diagnosis not present

## 2024-01-07 DIAGNOSIS — R110 Nausea: Secondary | ICD-10-CM | POA: Diagnosis not present

## 2024-01-13 ENCOUNTER — Other Ambulatory Visit: Payer: Self-pay | Admitting: Internal Medicine

## 2024-01-13 DIAGNOSIS — R1115 Cyclical vomiting syndrome unrelated to migraine: Secondary | ICD-10-CM

## 2024-01-13 DIAGNOSIS — R110 Nausea: Secondary | ICD-10-CM

## 2024-01-19 ENCOUNTER — Other Ambulatory Visit (HOSPITAL_COMMUNITY): Payer: Self-pay

## 2024-01-19 DIAGNOSIS — G4700 Insomnia, unspecified: Secondary | ICD-10-CM | POA: Diagnosis not present

## 2024-01-19 DIAGNOSIS — I10 Essential (primary) hypertension: Secondary | ICD-10-CM | POA: Diagnosis not present

## 2024-01-19 DIAGNOSIS — E039 Hypothyroidism, unspecified: Secondary | ICD-10-CM | POA: Diagnosis not present

## 2024-01-19 DIAGNOSIS — F419 Anxiety disorder, unspecified: Secondary | ICD-10-CM | POA: Diagnosis not present

## 2024-01-19 DIAGNOSIS — R1115 Cyclical vomiting syndrome unrelated to migraine: Secondary | ICD-10-CM | POA: Diagnosis not present

## 2024-01-19 DIAGNOSIS — K219 Gastro-esophageal reflux disease without esophagitis: Secondary | ICD-10-CM | POA: Diagnosis not present

## 2024-01-19 DIAGNOSIS — F325 Major depressive disorder, single episode, in full remission: Secondary | ICD-10-CM | POA: Diagnosis not present

## 2024-01-19 MED ORDER — ZOLPIDEM TARTRATE 10 MG PO TABS
10.0000 mg | ORAL_TABLET | Freq: Every day | ORAL | 3 refills | Status: AC
  Filled 2024-01-19: qty 30, 30d supply, fill #0

## 2024-01-19 MED ORDER — LISINOPRIL 20 MG PO TABS
20.0000 mg | ORAL_TABLET | Freq: Every day | ORAL | 1 refills | Status: AC
  Filled 2024-01-19 – 2024-02-12 (×2): qty 90, 90d supply, fill #0
  Filled 2024-05-31: qty 90, 90d supply, fill #1

## 2024-01-19 MED ORDER — PANTOPRAZOLE SODIUM 40 MG PO TBEC
40.0000 mg | DELAYED_RELEASE_TABLET | Freq: Every morning | ORAL | 2 refills | Status: AC
  Filled 2024-01-19: qty 30, 30d supply, fill #0

## 2024-01-19 MED ORDER — ALPRAZOLAM 0.5 MG PO TABS
0.5000 mg | ORAL_TABLET | Freq: Every day | ORAL | 3 refills | Status: AC | PRN
  Filled 2024-01-19: qty 30, 30d supply, fill #0

## 2024-01-19 MED ORDER — FLUOXETINE HCL 20 MG PO TABS
30.0000 mg | ORAL_TABLET | Freq: Every day | ORAL | 1 refills | Status: AC
  Filled 2024-01-19 – 2024-04-10 (×2): qty 135, 90d supply, fill #0

## 2024-01-19 MED ORDER — LEVOTHYROXINE SODIUM 88 MCG PO TABS
88.0000 ug | ORAL_TABLET | Freq: Every morning | ORAL | 1 refills | Status: AC
  Filled 2024-01-19 – 2024-02-12 (×2): qty 90, 90d supply, fill #0
  Filled 2024-05-18: qty 90, 90d supply, fill #1

## 2024-01-19 MED ORDER — PROMETHAZINE HCL 25 MG PO TABS
25.0000 mg | ORAL_TABLET | ORAL | 3 refills | Status: AC
  Filled 2024-01-19: qty 30, 15d supply, fill #0

## 2024-01-26 ENCOUNTER — Other Ambulatory Visit (HOSPITAL_COMMUNITY): Payer: Self-pay

## 2024-02-02 ENCOUNTER — Other Ambulatory Visit (HOSPITAL_COMMUNITY): Payer: Self-pay

## 2024-02-12 ENCOUNTER — Other Ambulatory Visit (HOSPITAL_COMMUNITY): Payer: Self-pay

## 2024-04-10 ENCOUNTER — Other Ambulatory Visit (HOSPITAL_COMMUNITY): Payer: Self-pay

## 2024-04-12 ENCOUNTER — Other Ambulatory Visit: Payer: Self-pay

## 2024-04-12 ENCOUNTER — Other Ambulatory Visit (HOSPITAL_COMMUNITY): Payer: Self-pay

## 2024-04-12 DIAGNOSIS — R110 Nausea: Secondary | ICD-10-CM | POA: Diagnosis not present

## 2024-04-12 DIAGNOSIS — R1013 Epigastric pain: Secondary | ICD-10-CM | POA: Diagnosis not present

## 2024-04-12 MED ORDER — ONDANSETRON HCL 8 MG PO TABS
8.0000 mg | ORAL_TABLET | Freq: Four times a day (QID) | ORAL | 1 refills | Status: AC | PRN
  Filled 2024-04-12: qty 60, 15d supply, fill #0

## 2024-04-12 MED ORDER — FAMOTIDINE 20 MG PO TABS
20.0000 mg | ORAL_TABLET | Freq: Every day | ORAL | 3 refills | Status: AC
  Filled 2024-04-12: qty 30, 30d supply, fill #0
  Filled 2024-05-31: qty 30, 30d supply, fill #1

## 2024-05-31 ENCOUNTER — Other Ambulatory Visit (HOSPITAL_COMMUNITY): Payer: Self-pay

## 2024-06-01 ENCOUNTER — Other Ambulatory Visit (HOSPITAL_COMMUNITY): Payer: Self-pay

## 2024-06-01 MED ORDER — ALPRAZOLAM 0.5 MG PO TABS
0.5000 mg | ORAL_TABLET | Freq: Every day | ORAL | 3 refills | Status: AC
  Filled 2024-06-01: qty 30, 30d supply, fill #0

## 2024-06-01 MED ORDER — PROMETHAZINE HCL 25 MG PO TABS
25.0000 mg | ORAL_TABLET | Freq: Four times a day (QID) | ORAL | 3 refills | Status: AC
  Filled 2024-06-01: qty 30, 8d supply, fill #0
